# Patient Record
Sex: Female | Born: 1980
Health system: Southern US, Community
[De-identification: ages and names within clinical notes are randomized; demographics above are authoritative.]

## PROBLEM LIST (undated history)

## (undated) ENCOUNTER — Inpatient Hospital Stay (HOSPITAL_COMMUNITY): Payer: Self-pay

## (undated) DIAGNOSIS — J45909 Unspecified asthma, uncomplicated: Secondary | ICD-10-CM

## (undated) HISTORY — PX: NO PAST SURGERIES: SHX2092

---

## 2008-09-10 ENCOUNTER — Inpatient Hospital Stay (HOSPITAL_COMMUNITY): Admission: AD | Admit: 2008-09-10 | Discharge: 2008-09-10 | Payer: Self-pay | Admitting: Obstetrics & Gynecology

## 2008-10-18 ENCOUNTER — Inpatient Hospital Stay (HOSPITAL_COMMUNITY): Admission: AD | Admit: 2008-10-18 | Discharge: 2008-10-18 | Payer: Self-pay | Admitting: Obstetrics & Gynecology

## 2008-11-22 ENCOUNTER — Ambulatory Visit (HOSPITAL_COMMUNITY): Admission: RE | Admit: 2008-11-22 | Discharge: 2008-11-22 | Payer: Self-pay | Admitting: Obstetrics & Gynecology

## 2008-12-13 ENCOUNTER — Ambulatory Visit (HOSPITAL_COMMUNITY): Admission: RE | Admit: 2008-12-13 | Discharge: 2008-12-13 | Payer: Self-pay | Admitting: Family Medicine

## 2009-04-23 ENCOUNTER — Inpatient Hospital Stay (HOSPITAL_COMMUNITY): Admission: AD | Admit: 2009-04-23 | Discharge: 2009-04-25 | Payer: Self-pay | Admitting: Obstetrics & Gynecology

## 2009-04-23 ENCOUNTER — Ambulatory Visit: Payer: Self-pay | Admitting: Obstetrics and Gynecology

## 2009-05-21 ENCOUNTER — Emergency Department (HOSPITAL_COMMUNITY): Admission: EM | Admit: 2009-05-21 | Discharge: 2009-05-21 | Payer: Self-pay | Admitting: Emergency Medicine

## 2009-05-29 ENCOUNTER — Ambulatory Visit: Payer: Self-pay | Admitting: Family Medicine

## 2010-06-01 ENCOUNTER — Emergency Department (HOSPITAL_COMMUNITY): Admission: EM | Admit: 2010-06-01 | Discharge: 2010-06-01 | Payer: Self-pay | Admitting: Emergency Medicine

## 2010-08-29 ENCOUNTER — Encounter (INDEPENDENT_AMBULATORY_CARE_PROVIDER_SITE_OTHER): Payer: Self-pay | Admitting: *Deleted

## 2010-08-29 LAB — CONVERTED CEMR LAB
ALT: 11 units/L (ref 0–35)
CO2: 27 meq/L (ref 19–32)
Creatinine, Ser: 0.56 mg/dL (ref 0.40–1.20)
Magnesium: 2.2 mg/dL (ref 1.5–2.5)
TSH: 1.78 microintl units/mL (ref 0.350–4.500)
Total Bilirubin: 0.3 mg/dL (ref 0.3–1.2)
Total CK: 37 units/L (ref 7–177)

## 2010-10-07 LAB — POCT URINALYSIS DIPSTICK
Bilirubin Urine: NEGATIVE
Glucose, UA: NEGATIVE mg/dL
Hgb urine dipstick: NEGATIVE
Nitrite: NEGATIVE

## 2010-10-07 LAB — POCT PREGNANCY, URINE: Preg Test, Ur: NEGATIVE

## 2010-10-30 LAB — CBC
HCT: 38.5 % (ref 36.0–46.0)
Hemoglobin: 13.1 g/dL (ref 12.0–15.0)
MCV: 85.3 fL (ref 78.0–100.0)
Platelets: 202 10*3/uL (ref 150–400)
RDW: 14 % (ref 11.5–15.5)
WBC: 6.8 10*3/uL (ref 4.0–10.5)

## 2010-10-30 LAB — URINE MICROSCOPIC-ADD ON

## 2010-10-30 LAB — DIFFERENTIAL
Eosinophils Absolute: 0.1 10*3/uL (ref 0.0–0.7)
Eosinophils Relative: 1 % (ref 0–5)
Lymphs Abs: 2.1 10*3/uL (ref 0.7–4.0)
Monocytes Absolute: 0.5 10*3/uL (ref 0.1–1.0)

## 2010-10-30 LAB — BASIC METABOLIC PANEL
BUN: 11 mg/dL (ref 6–23)
Chloride: 105 mEq/L (ref 96–112)
Glucose, Bld: 90 mg/dL (ref 70–99)
Potassium: 4.6 mEq/L (ref 3.5–5.1)
Sodium: 141 mEq/L (ref 135–145)

## 2010-10-30 LAB — URINALYSIS, ROUTINE W REFLEX MICROSCOPIC
Nitrite: NEGATIVE
Protein, ur: 30 mg/dL — AB
Urobilinogen, UA: 0.2 mg/dL (ref 0.0–1.0)

## 2010-10-30 LAB — WET PREP, GENITAL
WBC, Wet Prep HPF POC: NONE SEEN
Yeast Wet Prep HPF POC: NONE SEEN

## 2010-10-30 LAB — GC/CHLAMYDIA PROBE AMP, GENITAL: GC Probe Amp, Genital: NEGATIVE

## 2010-10-31 LAB — RPR: RPR Ser Ql: NONREACTIVE

## 2010-10-31 LAB — URINALYSIS, MICROSCOPIC ONLY
Glucose, UA: NEGATIVE mg/dL
Ketones, ur: NEGATIVE mg/dL
Nitrite: NEGATIVE
Specific Gravity, Urine: 1.01 (ref 1.005–1.030)
pH: 6.5 (ref 5.0–8.0)

## 2010-10-31 LAB — CBC
Hemoglobin: 11.5 g/dL — ABNORMAL LOW (ref 12.0–15.0)
MCHC: 33.4 g/dL (ref 30.0–36.0)
RBC: 4.06 MIL/uL (ref 3.87–5.11)
WBC: 8.8 10*3/uL (ref 4.0–10.5)

## 2010-11-04 ENCOUNTER — Inpatient Hospital Stay (INDEPENDENT_AMBULATORY_CARE_PROVIDER_SITE_OTHER)
Admission: RE | Admit: 2010-11-04 | Discharge: 2010-11-04 | Disposition: A | Discharge status: Home / Self Care | Payer: Self-pay | Source: Ambulatory Visit | Attending: Family Medicine | Admitting: Family Medicine

## 2010-11-04 DIAGNOSIS — J309 Allergic rhinitis, unspecified: Secondary | ICD-10-CM

## 2010-11-06 LAB — DIFFERENTIAL
Basophils Relative: 0 % (ref 0–1)
Eosinophils Absolute: 0.2 10*3/uL (ref 0.0–0.7)
Lymphocytes Relative: 19 % (ref 12–46)
Lymphs Abs: 2.1 10*3/uL (ref 0.7–4.0)
Neutro Abs: 8.1 10*3/uL — ABNORMAL HIGH (ref 1.7–7.7)
Neutrophils Relative %: 71 % (ref 43–77)

## 2010-11-06 LAB — URINALYSIS, ROUTINE W REFLEX MICROSCOPIC
Bilirubin Urine: NEGATIVE
Hgb urine dipstick: NEGATIVE
Protein, ur: NEGATIVE mg/dL
Specific Gravity, Urine: 1.015 (ref 1.005–1.030)
Urobilinogen, UA: 0.2 mg/dL (ref 0.0–1.0)

## 2010-11-06 LAB — CBC
Platelets: 184 10*3/uL (ref 150–400)
RDW: 12.5 % (ref 11.5–15.5)
WBC: 11.5 10*3/uL — ABNORMAL HIGH (ref 4.0–10.5)

## 2010-11-11 LAB — CBC
Hemoglobin: 13.1 g/dL (ref 12.0–15.0)
MCV: 86.7 fL (ref 78.0–100.0)
RBC: 4.48 MIL/uL (ref 3.87–5.11)
WBC: 10.3 10*3/uL (ref 4.0–10.5)

## 2010-11-11 LAB — URINALYSIS, ROUTINE W REFLEX MICROSCOPIC
Bilirubin Urine: NEGATIVE
Hgb urine dipstick: NEGATIVE
Nitrite: NEGATIVE
Specific Gravity, Urine: 1.005 (ref 1.005–1.030)
Urobilinogen, UA: 0.2 mg/dL (ref 0.0–1.0)
pH: 6 (ref 5.0–8.0)

## 2010-11-11 LAB — HCG, QUANTITATIVE, PREGNANCY: hCG, Beta Chain, Quant, S: 13511 m[IU]/mL — ABNORMAL HIGH (ref <5)

## 2010-11-11 LAB — ABO/RH: ABO/RH(D): A POS

## 2010-11-11 LAB — GC/CHLAMYDIA PROBE AMP, GENITAL: Chlamydia, DNA Probe: NEGATIVE

## 2011-01-15 ENCOUNTER — Other Ambulatory Visit: Payer: Self-pay | Admitting: Family Medicine

## 2011-03-14 ENCOUNTER — Emergency Department: Payer: Self-pay

## 2011-03-14 ENCOUNTER — Emergency Department (HOSPITAL_COMMUNITY)
Admission: EM | Admit: 2011-03-14 | Discharge: 2011-03-14 | Disposition: A | Discharge status: Home / Self Care | Payer: Self-pay | Attending: Emergency Medicine | Admitting: Emergency Medicine

## 2011-03-14 DIAGNOSIS — IMO0001 Reserved for inherently not codable concepts without codable children: Secondary | ICD-10-CM | POA: Insufficient documentation

## 2011-03-14 DIAGNOSIS — R109 Unspecified abdominal pain: Secondary | ICD-10-CM | POA: Insufficient documentation

## 2011-03-14 DIAGNOSIS — R1110 Vomiting, unspecified: Secondary | ICD-10-CM | POA: Insufficient documentation

## 2011-03-14 DIAGNOSIS — J45909 Unspecified asthma, uncomplicated: Secondary | ICD-10-CM | POA: Insufficient documentation

## 2011-03-14 LAB — CBC
Platelets: 222 10*3/uL (ref 150–400)
RBC: 4.44 MIL/uL (ref 3.87–5.11)
RDW: 12.6 % (ref 11.5–15.5)
WBC: 10 10*3/uL (ref 4.0–10.5)

## 2011-03-14 LAB — COMPREHENSIVE METABOLIC PANEL
AST: 13 U/L (ref 0–37)
CO2: 28 mEq/L (ref 19–32)
Calcium: 9.5 mg/dL (ref 8.4–10.5)
Creatinine, Ser: 0.68 mg/dL (ref 0.50–1.10)
GFR calc non Af Amer: >60 mL/min (ref >60)
Total Protein: 7.2 g/dL (ref 6.0–8.3)

## 2011-03-14 LAB — URINALYSIS, ROUTINE W REFLEX MICROSCOPIC
Bilirubin Urine: NEGATIVE
Leukocytes, UA: NEGATIVE
Nitrite: NEGATIVE
Specific Gravity, Urine: 1.02 (ref 1.005–1.030)
Urobilinogen, UA: 0.2 mg/dL (ref 0.0–1.0)
pH: 6 (ref 5.0–8.0)

## 2011-03-14 LAB — DIFFERENTIAL
Basophils Absolute: 0 10*3/uL (ref 0.0–0.1)
Basophils Relative: 0 % (ref 0–1)
Eosinophils Absolute: 0.1 10*3/uL (ref 0.0–0.7)
Eosinophils Relative: 1 % (ref 0–5)
Neutrophils Relative %: 59 % (ref 43–77)

## 2011-03-14 LAB — URINE MICROSCOPIC-ADD ON

## 2011-03-14 LAB — POCT PREGNANCY, URINE: Preg Test, Ur: NEGATIVE

## 2011-07-03 ENCOUNTER — Encounter: Payer: Self-pay | Admitting: Emergency Medicine

## 2011-07-03 ENCOUNTER — Emergency Department (HOSPITAL_COMMUNITY): Payer: Self-pay

## 2011-07-03 ENCOUNTER — Other Ambulatory Visit: Payer: Self-pay

## 2011-07-03 ENCOUNTER — Emergency Department (HOSPITAL_COMMUNITY)
Admission: EM | Admit: 2011-07-03 | Discharge: 2011-07-03 | Disposition: A | Discharge status: Home / Self Care | Payer: Self-pay | Attending: Emergency Medicine | Admitting: Emergency Medicine

## 2011-07-03 DIAGNOSIS — M546 Pain in thoracic spine: Secondary | ICD-10-CM | POA: Insufficient documentation

## 2011-07-03 DIAGNOSIS — R509 Fever, unspecified: Secondary | ICD-10-CM | POA: Insufficient documentation

## 2011-07-03 DIAGNOSIS — R0789 Other chest pain: Secondary | ICD-10-CM | POA: Insufficient documentation

## 2011-07-03 DIAGNOSIS — N644 Mastodynia: Secondary | ICD-10-CM | POA: Insufficient documentation

## 2011-07-03 DIAGNOSIS — R0602 Shortness of breath: Secondary | ICD-10-CM | POA: Insufficient documentation

## 2011-07-03 LAB — CBC
Hemoglobin: 13.3 g/dL (ref 12.0–15.0)
MCHC: 34.7 g/dL (ref 30.0–36.0)
RDW: 12.4 % (ref 11.5–15.5)

## 2011-07-03 LAB — URINALYSIS, ROUTINE W REFLEX MICROSCOPIC
Glucose, UA: NEGATIVE mg/dL
Leukocytes, UA: NEGATIVE
Protein, ur: NEGATIVE mg/dL
Specific Gravity, Urine: 1.018 (ref 1.005–1.030)
Urobilinogen, UA: 0.2 mg/dL (ref 0.0–1.0)

## 2011-07-03 LAB — PREGNANCY, URINE: Preg Test, Ur: NEGATIVE

## 2011-07-03 LAB — D-DIMER, QUANTITATIVE: D-Dimer, Quant: 1.89 ug/mL-FEU — ABNORMAL HIGH (ref 0.00–0.48)

## 2011-07-03 LAB — DIFFERENTIAL
Basophils Absolute: 0 10*3/uL (ref 0.0–0.1)
Basophils Relative: 0 % (ref 0–1)
Neutro Abs: 13.6 10*3/uL — ABNORMAL HIGH (ref 1.7–7.7)
Neutrophils Relative %: 77 % (ref 43–77)

## 2011-07-03 LAB — BASIC METABOLIC PANEL
Chloride: 101 mEq/L (ref 96–112)
GFR calc Af Amer: >90 mL/min (ref >90)
Potassium: 3.5 mEq/L (ref 3.5–5.1)

## 2011-07-03 MED ORDER — IOHEXOL 300 MG/ML  SOLN
100.0000 mL | Freq: Once | INTRAMUSCULAR | Status: AC | PRN
  Administered 2011-07-03: 100 mL via INTRAVENOUS

## 2011-07-03 MED ORDER — OXYCODONE-ACETAMINOPHEN 5-325 MG PO TABS
2.0000 | ORAL_TABLET | Freq: Once | ORAL | Status: AC
  Administered 2011-07-03: 2 via ORAL
  Filled 2011-07-03: qty 2

## 2011-07-03 MED ORDER — IBUPROFEN 800 MG PO TABS: 800.0000 mg | ORAL_TABLET | Freq: Three times a day (TID) | ORAL | Status: AC

## 2011-07-03 MED ORDER — IBUPROFEN 800 MG PO TABS
800.0000 mg | ORAL_TABLET | Freq: Once | ORAL | Status: AC
  Administered 2011-07-03: 800 mg via ORAL
  Filled 2011-07-03: qty 1

## 2011-07-03 NOTE — ED Notes (Signed)
Pt returned from CT °

## 2011-07-03 NOTE — ED Notes (Signed)
Patient transported to CT 

## 2011-07-03 NOTE — ED Notes (Signed)
PT. REPORTS LEFT BREAST PAIN / GENERALIZED BACK PAIN ONSET 9 PM THIS EVENING .  SLIGHT SOB , DENIES COUGH  PAIN WITH PALPATION ,  FRIEND TRANSLATING (SPANISH) FOR PT.  CHILLS AND FEVER TODAY .

## 2011-07-03 NOTE — ED Provider Notes (Signed)
History     CSN: [id number] Arrival date & time: 07/03/2011 12:19 AM   First MD Initiated Contact with Patient 07/03/11 0111      Chief Complaint  Patient presents with  . Breast Pain    (Consider location/radiation/quality/duration/timing/severity/associated sxs/prior treatment) HPI Comments: Spanish-speaking female presenting with left breast pain, left-sided chest pain, left upper back pain onset 4 hours ago. Is associated with some shortness of breath and subjective fevers and chills. Patient reports no medical history no history of cardiac or lung problems. She's not had any cough.  She taking ibuprofen for the pain with some relief. She's had chills and subjective fevers. Is no bleeding or drainage from her breast or nipple. She does not think that she is pregnant. She had this pain in her breast last month it improved with ibuprofen and went away. It is not coincide with her menstrual period. She denies any trauma to the breast.  The history is provided by the patient and a friend. The history is limited by a language barrier. A language interpreter was used.    History reviewed. No pertinent past medical history.  History reviewed. No pertinent past surgical history.  No family history on file.  History  Substance Use Topics  . Smoking status: Never Smoker   . Smokeless tobacco: Not on file  . Alcohol Use: No    OB History    Grav Para Term Preterm Abortions TAB SAB Ect Mult Living                  Review of Systems  Unable to perform ROS: Other    Allergies  Review of patient's allergies indicates no known allergies.  Home Medications   Current Outpatient Rx  Name Route Sig Dispense Refill  . IBUPROFEN 600 MG PO TABS Oral Take 600 mg by mouth every 6 (six) hours as needed. For pain     . IBUPROFEN 800 MG PO TABS Oral Take 1 tablet (800 mg total) by mouth 3 (three) times daily. 21 tablet 0    BP 111/65  Pulse 98  Temp(Src) 97.4 F (36.3 C) (Oral)  Resp  24  SpO2 98%  LMP 06/23/2011  Physical Exam  Constitutional: She is oriented to person, place, and time. She appears well-developed and well-nourished. No distress.  HENT:  Head: Normocephalic and atraumatic.  Mouth/Throat: Oropharynx is clear and moist. No oropharyngeal exudate.  Eyes: Conjunctivae are normal. Pupils are equal, round, and reactive to light.  Neck: Normal range of motion.  Cardiovascular: Normal rate, regular rhythm and normal heart sounds.   Pulmonary/Chest: Effort normal and breath sounds normal. No respiratory distress. She exhibits tenderness.       Breast exam performed with chaperone in the room. Breasts are symmetrical and equal. There is no overlying skin change. The left breast is diffusely tender to palpation without any erythema, edema, mass, fluctuance. No nipple discharge. No skin retraction. Left upper chest wall and upper back also tender to palpation  Abdominal: Soft. There is no tenderness. There is no rebound and no guarding.  Musculoskeletal: Normal range of motion. She exhibits no edema and no tenderness.  Neurological: She is alert and oriented to person, place, and time. No cranial nerve deficit.  Skin: Skin is warm.    ED Course  Procedures (including critical care time)  Labs Reviewed  CBC - Abnormal; Notable for the following:    WBC 17.7 (*)    All other components within normal limits  DIFFERENTIAL -  Abnormal; Notable for the following:    Neutro Abs 13.6 (*)    Monocytes Absolute 1.4 (*)    All other components within normal limits  D-DIMER, QUANTITATIVE - Abnormal; Notable for the following:    D-Dimer, Quant 1.89 (*)    All other components within normal limits  URINALYSIS, ROUTINE W REFLEX MICROSCOPIC  PREGNANCY, URINE  BASIC METABOLIC PANEL   Dg Chest 2 View  07/03/2011  *RADIOLOGY REPORT*  Clinical Data: Left chest pain for 1 day.  CHEST - 2 VIEW  Comparison: None.  Findings: The heart size and mediastinal contours are normal.  There are low lung volumes with mild bibasilar atelectasis.  No focal airspace disease, pleural effusion or pneumothorax is identified.  Osseous structures appear normal.  IMPRESSION: Mild bibasilar atelectasis.  Original Report Authenticated By: Gerrianne Scale, M.D.   Ct Angio Chest W/cm &/or Wo Cm  07/03/2011  *RADIOLOGY REPORT*  Clinical Data:  Left-sided chest pain and elevated D-dimer.  CT ANGIOGRAPHY CHEST WITH CONTRAST  Technique:  Multidetector CT imaging of the chest was performed using the standard protocol during bolus administration of intravenous contrast.  Multiplanar CT image reconstructions including MIPs were obtained to evaluate the vascular anatomy.  Contrast: OMNIPAQUE IOHEXOL 300 MG/ML IV SOLN  Comparison:  Chest radiograph performed earlier today at 01:31 a.m.  Findings:  There is no evidence of pulmonary embolus.  Minimal bilateral dependent subsegmental atelectasis is noted.  The lungs are otherwise clear.  There is no evidence of significant focal consolidation, pleural effusion or pneumothorax.  No masses are identified; no abnormal focal contrast enhancement is seen.  The mediastinum is unremarkable in appearance.  No mediastinal lymphadenopathy is seen.  No pericardial effusion is identified. The great vessels are unremarkable in appearance.  No axillary lymphadenopathy is seen.  The visualized portions of the thyroid gland are unremarkable in appearance.  The visualized portions of the liver and spleen are unremarkable.  No acute osseous abnormalities are seen.  Review of the MIP images confirms the above findings.  IMPRESSION:  1.  No evidence of pulmonary embolus. 2.  Minimal bilateral dependent subsegmental atelectasis noted; lungs otherwise clear.  Original Report Authenticated By: Tonia Ghent, M.D.     1. Mastalgia       MDM  Reproducible left-sided chest, upper back pain with slight shortness of breath. Patient is in no distress, lungs clear and equal  bilaterally without wheezing.  We'll perform breast exam, obtain EKG, chest Xray and D-dimer, HCG, UA.  No overlying skin change to breast.  No nipple discharge, no skin retraction.  No fluctuance, erythema, induration or edema. Breasts symmetrical.   Date: 07/03/2011  Rate: 85 Rhythm: normal sinus rhythm  QRS Axis: normal  Intervals: normal  ST/T Wave abnormalities: normal  Conduction Disutrbances:none  Narrative Interpretation:   Old EKG Reviewed: none available   CT PE negative.  Pain has improved.  Leukocytosis noted. No evidence of pneumonia, UTI, skin infection of the breast. Patient stable for outpatient followup. Results discussed through interpreter.  Patient and husband express understanding.   Glynn Octave, MD 07/03/11 [phone]

## 2011-07-03 NOTE — ED Notes (Signed)
Pt returned from xray

## 2011-07-03 NOTE — ED Notes (Signed)
Patient transported to X-ray 

## 2012-01-22 ENCOUNTER — Other Ambulatory Visit (HOSPITAL_COMMUNITY): Payer: Self-pay | Admitting: Internal Medicine

## 2012-01-22 ENCOUNTER — Ambulatory Visit (HOSPITAL_COMMUNITY)
Admission: RE | Admit: 2012-01-22 | Discharge: 2012-01-22 | Disposition: A | Discharge status: Home / Self Care | Payer: Self-pay | Source: Ambulatory Visit | Attending: Internal Medicine | Admitting: Internal Medicine

## 2012-01-22 DIAGNOSIS — R52 Pain, unspecified: Secondary | ICD-10-CM

## 2012-01-22 DIAGNOSIS — M25539 Pain in unspecified wrist: Secondary | ICD-10-CM | POA: Insufficient documentation

## 2013-06-05 IMAGING — CT CT ANGIO CHEST
2 of 6 series · 19 of 46 positions shown · IV contrast (APPLIED)
Comparison: Chest radiograph performed earlier today at [DATE] a.m.

CLINICAL DATA: Left-sided chest pain and elevated D-dimer.

CT ANGIOGRAPHY CHEST WITH CONTRAST
TECHNIQUE: Multidetector CT imaging of the chest was performed
using the standard protocol during bolus administration of
intravenous contrast.  Multiplanar CT image reconstructions
including MIPs were obtained to evaluate the vascular anatomy.
Contrast: 100mL OMNIPAQUE IOHEXOL 300 MG/ML IV SOLN

[Series 6: pulm embolism 1.0 b25f thin · axial · 0.63mm/px · z∈[+1168,+1353]mm · 16 of 203 slices shown]
[im 9/203  lung]
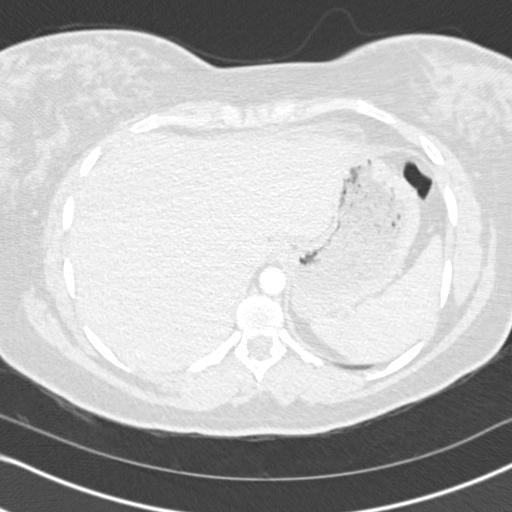
[im 27/203  soft-tissue]
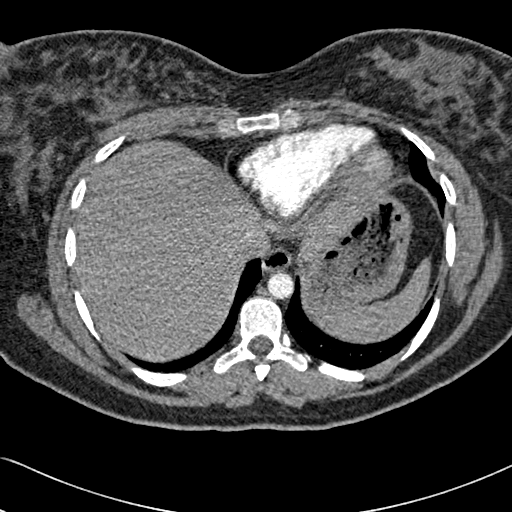
[im 36/203  lung]
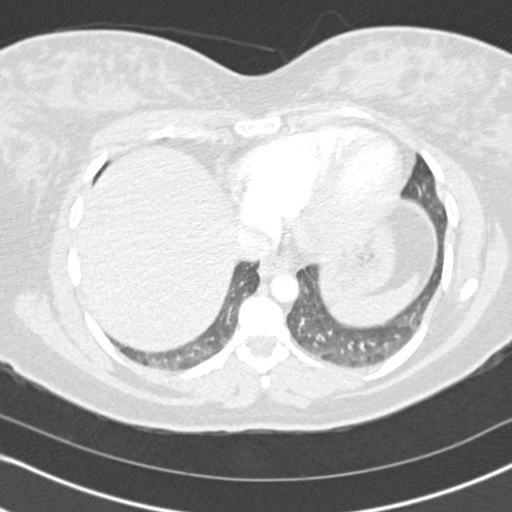
[im 44/203  soft-tissue]
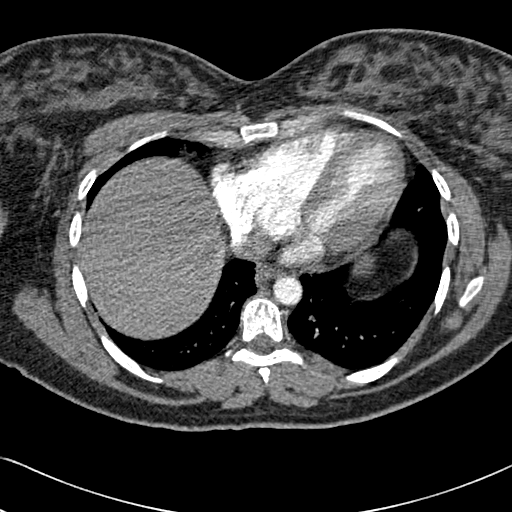
[im 62/203  lung]
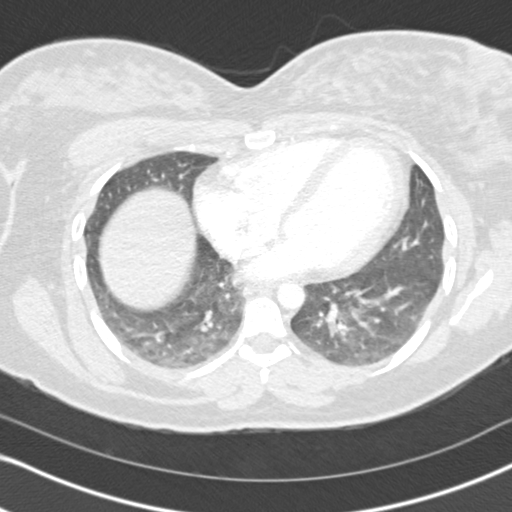
[im 71/203  soft-tissue]
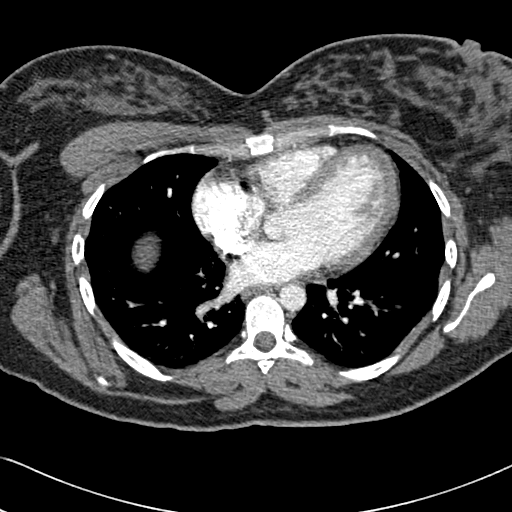
[im 80/203  lung]
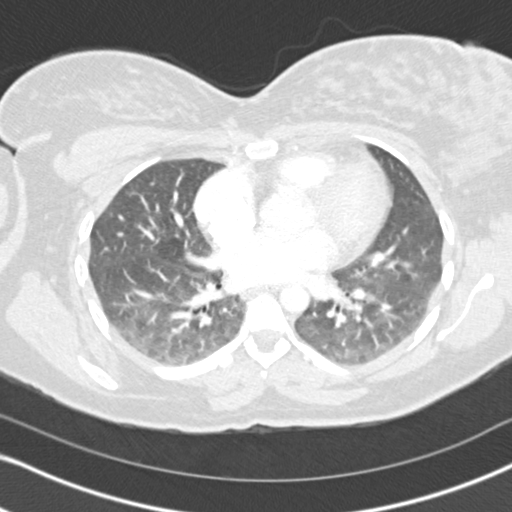
[im 97/203  soft-tissue]
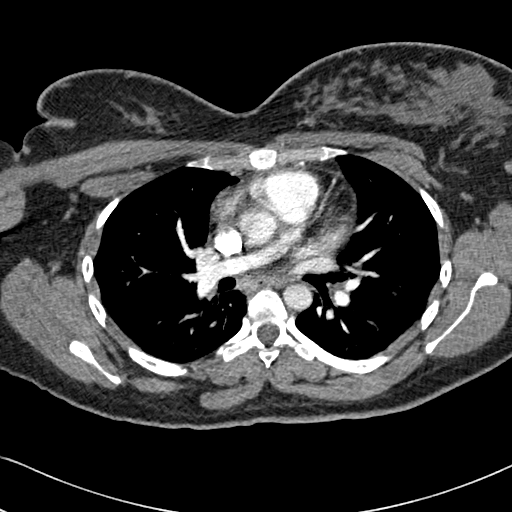
[im 106/203  lung]
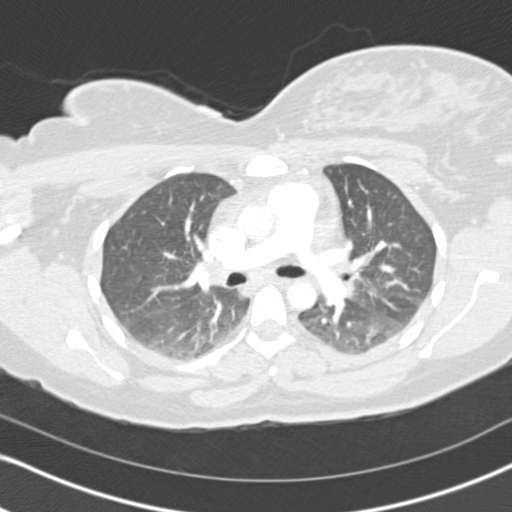
[im 123/203  soft-tissue]
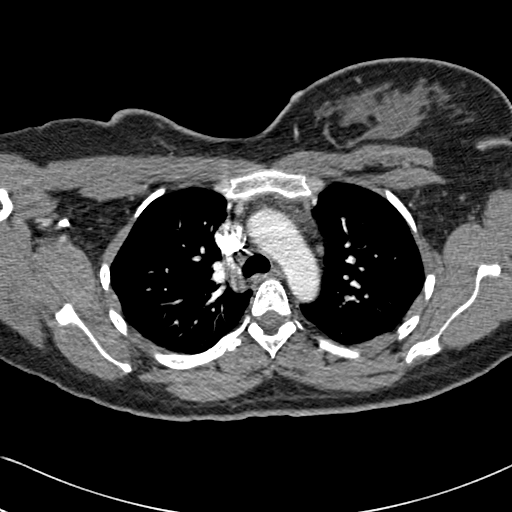
[im 132/203  lung]
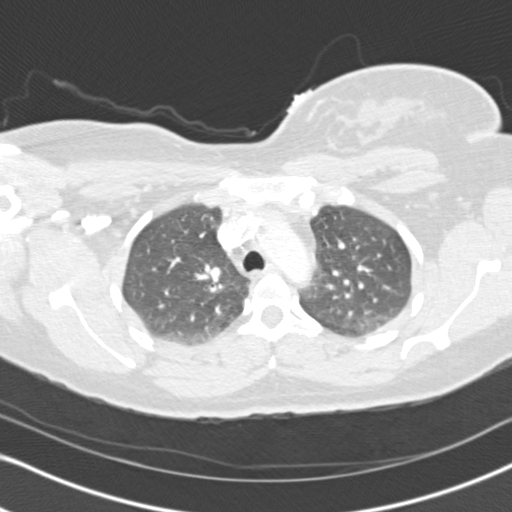
[im 141/203  soft-tissue]
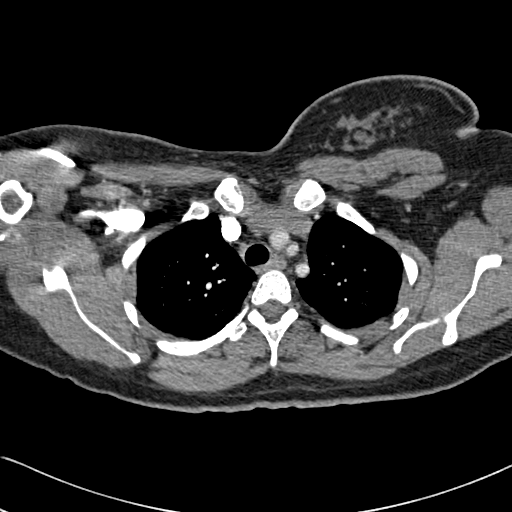
[im 159/203  lung]
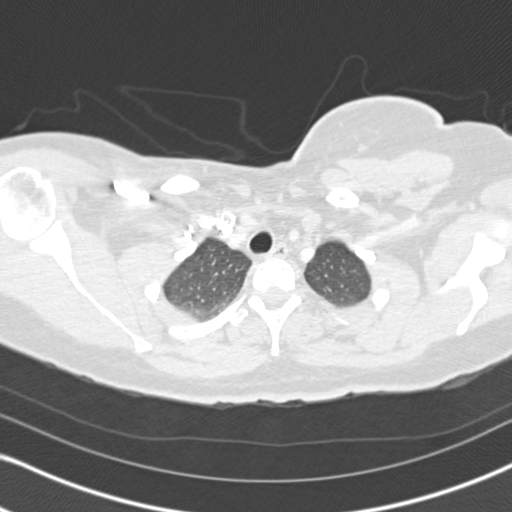
[im 167/203  soft-tissue]
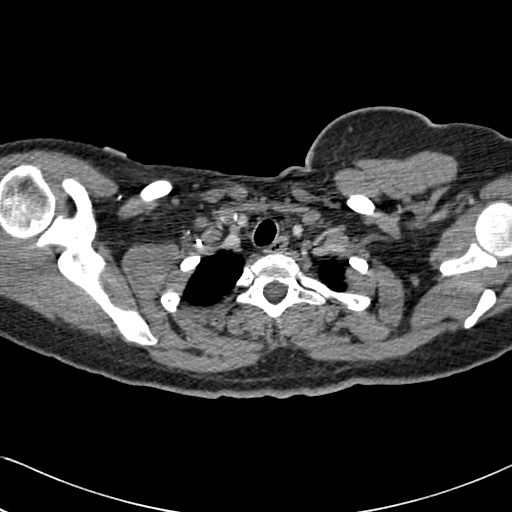
[im 176/203  lung]
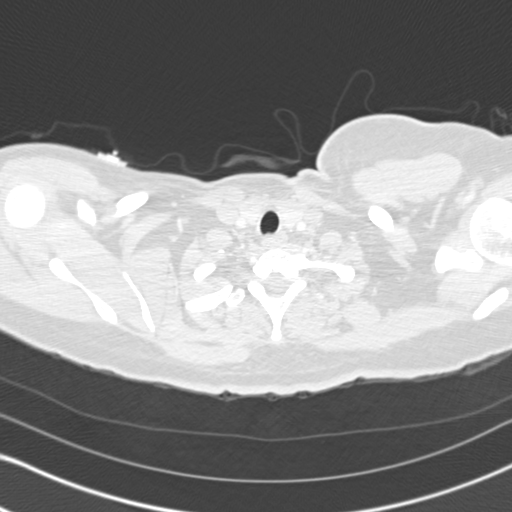
[im 194/203  soft-tissue]
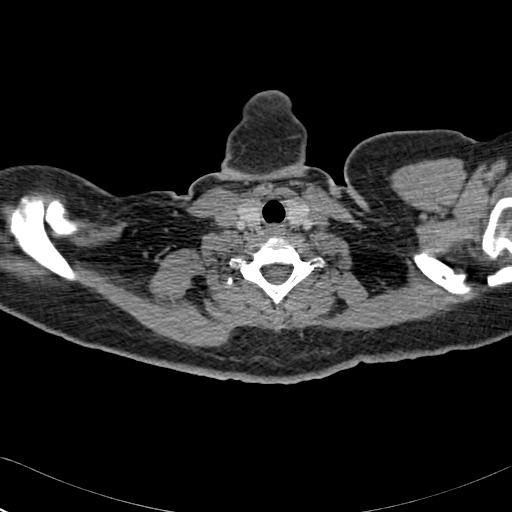

[Series 602: coronal · coronal · 0.63mm/px · 3 of 96 slices shown]
[im 24/96  soft-tissue]
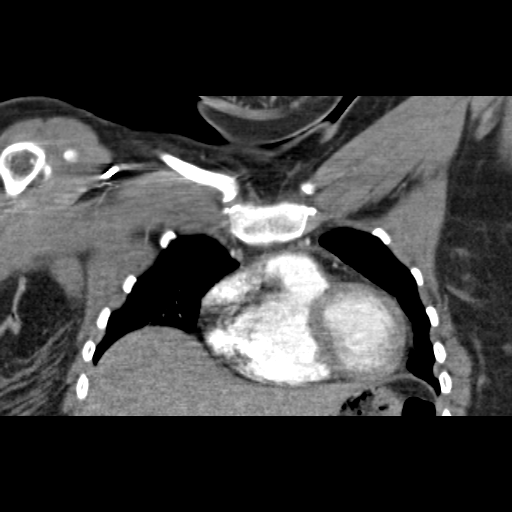
[im 48/96  soft-tissue]
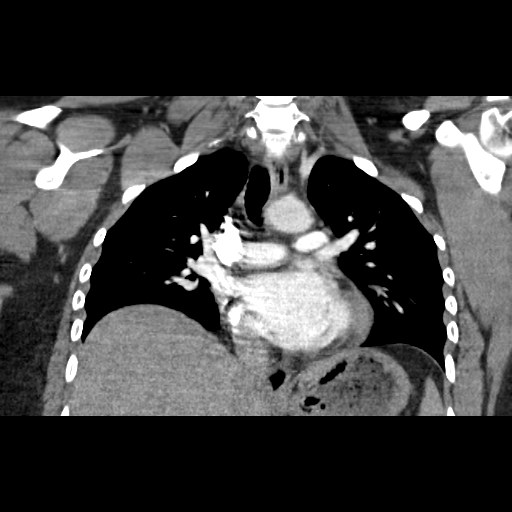
[im 72/96  soft-tissue]
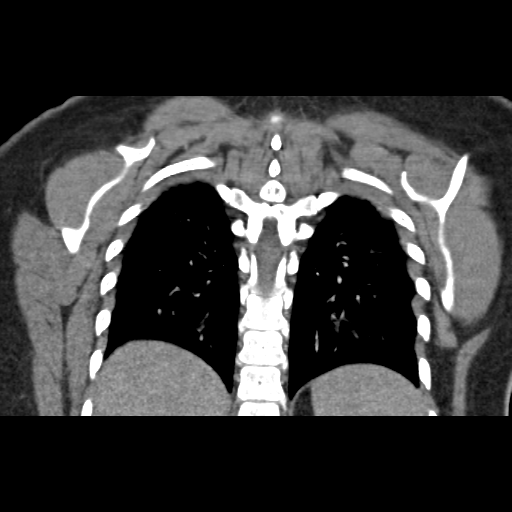

[19 of 46 positions shown; findings below may reference images not displayed]

FINDINGS: There is no evidence of pulmonary embolus.

Minimal bilateral dependent subsegmental atelectasis is noted.  The
lungs are otherwise clear.  There is no evidence of significant
focal consolidation, pleural effusion or pneumothorax.  No masses
are identified; no abnormal focal contrast enhancement is seen.

The mediastinum is unremarkable in appearance.  No mediastinal
lymphadenopathy is seen.  No pericardial effusion is identified.
The great vessels are unremarkable in appearance.  No axillary
lymphadenopathy is seen.  The visualized portions of the thyroid
gland are unremarkable in appearance.

The visualized portions of the liver and spleen are unremarkable.

No acute osseous abnormalities are seen.

Review of the MIP images confirms the above findings.
IMPRESSION: 1.  No evidence of pulmonary embolus.
2.  Minimal bilateral dependent subsegmental atelectasis noted;
lungs otherwise clear.

## 2013-09-11 ENCOUNTER — Ambulatory Visit: Payer: No Typology Code available for payment source | Attending: Internal Medicine

## 2013-09-25 ENCOUNTER — Telehealth: Payer: Self-pay | Admitting: General Practice

## 2013-09-25 NOTE — Telephone Encounter (Signed)
Called pt and left voicemail stating that pt's [facility] orange card is ready to be picked up.

## 2013-10-02 ENCOUNTER — Emergency Department (HOSPITAL_COMMUNITY)
Admission: EM | Admit: 2013-10-02 | Discharge: 2013-10-02 | Disposition: A | Discharge status: Home / Self Care | Payer: No Typology Code available for payment source | Attending: Emergency Medicine | Admitting: Emergency Medicine

## 2013-10-02 ENCOUNTER — Encounter (HOSPITAL_COMMUNITY): Payer: Self-pay | Admitting: Emergency Medicine

## 2013-10-02 ENCOUNTER — Emergency Department (HOSPITAL_COMMUNITY): Payer: No Typology Code available for payment source

## 2013-10-02 DIAGNOSIS — M25571 Pain in right ankle and joints of right foot: Secondary | ICD-10-CM

## 2013-10-02 DIAGNOSIS — M25579 Pain in unspecified ankle and joints of unspecified foot: Secondary | ICD-10-CM | POA: Insufficient documentation

## 2013-10-02 DIAGNOSIS — W19XXXS Unspecified fall, sequela: Secondary | ICD-10-CM | POA: Insufficient documentation

## 2013-10-02 MED ORDER — NAPROXEN 500 MG PO TABS: 500.0000 mg | ORAL_TABLET | Freq: Two times a day (BID) | ORAL | Status: DC

## 2013-10-02 MED ORDER — TRAMADOL HCL 50 MG PO TABS: 50.0000 mg | ORAL_TABLET | Freq: Four times a day (QID) | ORAL | Status: DC | PRN

## 2013-10-02 NOTE — ED Notes (Signed)
Pt here for eval of left ankle pain due to old ankle injury.

## 2013-10-02 NOTE — Discharge Instructions (Signed)
Recommend rest, naproxen as prescribed for pain, elevation, and support with an ankle brace. You may take tramadol as needed for severe pain if naproxen does not manage or pain well enough. Followup with an orthopedist. Return if symptoms worsen.  Artralgia (Arthralgia) El profesional que le asiste ha diagnosticado que usted padece de artralgia. Artralgia significa simplemente dolor en una articulacin. Las causas pueden ser muchas; entre ellas se incluyen:  Una lesin en la articulacin que provoca inflamacin (molestias).  Desgaste de las articulaciones que se produce con el avance de los aos (osteoartritis).  Uso excesivo de Nurse, learning disability.  Diversas formas de artritis.  Infecciones en la articulacin. Cualquiera que sea la causa del dolor, generalmente hay buena respuesta a los antiinflamatorios y al reposo. La excepcin ocurre cuando la articulacin est infectada y [address], si la causa es una infeccin bacteriana (por una bacteria), se tratan con antibiticos (medicamentos que [employer] grmenes). INSTRUCCIONES PARA EL CUIDADO DOMICILIARIO  Mantenga en reposo la zona lesionada durante el tiempo que se lo indique su mdico, o el tiempo que le indique el profesional que lo asiste. Luego comience a Chemical engineer esa articulacin lentamente del modo en que se lo aconseje el profesional y a Dentist se lo permita. El uso de [employer] puede ser beneficioso en el caso de lesiones en el tobillo, rodilla o cadera. Si la rodilla est entablillada o enyesada, muvala y cudela como se le indic. Si hoy le han colocado un venda elstica o que se estira), debe retirarlo y colocarlo nuevamente cada 3  4 horas. No debe aplicarlo de manera muy apretada, pero s con la firmeza necesaria para evitar la hinchazn. Vigile los dedos de las manos y los pies y observe si se hinchan, se tornan azules, se enfran o entumecen o siente dolor intenso. Si se presenta alguno de estos sntomas (problemas),  retire el vendaje y aplquelo nuevamente de un modo ms flojo. Si estos sntomas persisten, contacte al profesional que lo asiste o acuda nuevamente a Educational psychologist.  Durante las primeras 24 horas, mientras est [place], mantenga elevada la extremidad [employer] 2 almohadas.  Mientras se encuentra despierto Systems analyst aplique hielo durante 15 a 20 minutos, cada dos horas, para Engineer, materials; luego 3 a 4 veces por Environmental consultant las primeras 48 horas. Ponga el hielo en una bolsa plstica y coloque una toalla entre la bolsa y la piel.  Use la tablilla, el yeso, el vendaje elstico o el cabestrillo segn se le ha indicado.  Utilice los medicamentos de venta libre o de prescripcin para Chief Technology Officer, Environmental health practitioner o la [place], segn se lo indique el profesional que lo asiste. No tomeaspirina inmediatamente despus de la lesin a menos que se lo haya indicado su mdico. La aspirina puede ocasionarle un aumento en el sangrado y moretones en los tejidos.  Si se le aconsej la utilizacin de [place], contine usndolas segn las instrucciones y no vuelva a soportar peso sobre la articulacin lesionada hasta que se le indique que puede [place]. Un dolor persistente y la incapacidad para Chemical engineer el rea afectada durante 2  3 das son seales de Control and instrumentation engineer que le indican que debe consultar a un profesional para Education officer, environmental un seguimiento cuanto antes. Al comienzo, una fractura (ruptura del hueso) lineal puede no ser visible en las radiografas. Un dolor e hinchazn persistentes indican que necesita una evaluacin ms profunda, que no debe utilizar la articulacin lesionada o Counselling psychologist (use las muletas si  se le ha indicado) y/o que debe realizarse radiografas complementarias. En muchos casos las radiografas no revelan las fracturas pequeas hasta una semana o diez das ms tarde. Concierte una cita para Medical sales representativerealizar un seguimiento con el profesional que lo asiste o con aqul al que lo hayan remitido. Es  posible que un radilogo (especialista en la lectura de radiografas) revise nuevamente sus placas. Asegrese de Financial risk analystaveriguar cmo retirar los resultados de sus radiografas. No piense que el resultado es normal por el hecho de que no lo hayamos llamado. SOLICITE ATENCIN MDICA SI: Aumentan los moretones, la hinchazn o Chief Technology Officerel dolor. SOLICITE ATENCIN MDICA DE INMEDIATO SI:  Sus dedos estn adormecidos o presentan una Secondary school teachercoloracin azul.  El dolor no responde a los medicamentos y sigue igual o Insurance underwriterempeora.  El dolor en la articulacin se intensifica.  La temperatura eleva por encima de 38,9 C (102 F).  Le resulta cada vez ms difcil moverla. EST SEGURO QUE:   Comprende las instrucciones para el alta mdica.  Controlar su enfermedad.  Solicitar atencin mdica de inmediato segn las indicaciones. Document Released: 07/13/2005 Document Revised: 10/05/2011 [facility] InstituteExitCare Patient Information 2014 [place], [place]. Reposo, hielo, compresin y elevacin: Cuidados de rutina para los traumatismos  (RICE: Routine Care for Injuries) Los cuidados de rutina de muchas lesiones incluyen reposo, hielo, compresin y elevacin. INSTRUCCIONES PARA EL CUIDADO EN EL HOGAR  El reposo es necesario para permitir que el cuerpo se cure. Podrn reanudarse las actividades de rutina cuando se sienta cmodo. Las lesiones en tendones (estructuras similares a una cuerda que unen los msculos al [place]o) y huesos demoran aproximadamente seis semanas en curarse. Si aplicamos hielo luego de una lesin, podemos evitar la hinchazn y Glass blower/designerreducir el dolor. Ponga el hielo en una bolsa plstica. Colquese una toalla entre la piel y la bolsa de hielo. Deje el hielo durante 15 a 20 minutos, 3 a 4 veces por da. Hgalo mientras se encuentre despierto, durante las primeras 24 a 48 horas. Luego, aplquelo segn las indicaciones del profesional que lo asiste. La compresin ayuda a Building services engineerreducir la hinchazn. Tambin brinda sostn y [place] Grenadinesayuda a Clear Channel Communicationsaliviar las  molestias. Si hoy le han colocado un vendaje elstico, debe retirarlo y colocarlo nuevamente cada 3  4 horas. No debe colocarlo de forma muy apretada, pero s con la firmeza necesaria para evitar la hinchazn. Vigile los dedos de las manos o de los pies y observe si se hinchan, se tornan azules, se enfran o entumecen o siente dolor intenso. Si se presentan algunos de estos sntomas (problemas), retire el vendaje y aplquelo nuevamente de un modo ms flojo. Si estos sntomas persisten, contacte al profesional que lo asiste. La elevacin ayuda a reducir la hinchazn y Engineer, materialsdisminuye el dolor. Si la lesin se localiza en las extremidades (brazos/manos y piernas/pies), la zona lesionada debera colocarse por arriba de la altura del corazn, de ser posible. SOLICITE ATENCIN MDICA DE INMEDIATO SI:  El dolor o la hinchazn persisten. La zona est roja, adormecida o la siente dbil. Los sntomas empeoran en vez de mejorar durante Time Warneralgunos das. Estos sntomas pueden indicar que es necesaria una evaluacin ms profunda o nuevas radiografas. En algunos casos, las radiografas no muestran que hay un hueso pequeo roto (fractura) hasta 1 semana o 10 das ms tarde. Cumpla con las citas de seguimiento tal como le indic el profesional que lo asiste. Consulte con su mdico la fecha en que los resultados de las radiografas estarn disponibles. Asegrese de Starbucks Corporationobtener los resultados de las radiografas.  Document Released:  04/22/2005 Document Revised: 10/05/2011 ExitCare Patient Information 2014 [place], [place].

## 2013-10-02 NOTE — ED Notes (Signed)
Discharge instructions reviewed with pt and pt's husband.

## 2013-10-02 NOTE — ED Notes (Signed)
Pt reports "twisting" ankle appx 1 year ago when walking down stairs. Pt reports continued pain and unable to place direct pressure. Pt has mild bruising and swelling to interior of midfoot. Pt has brace in place from pharmacy. Denies ever receiving xray on area.

## 2013-10-02 NOTE — ED Provider Notes (Signed)
CSN: [id number]     Arrival date & time 10/02/13  1736 History   None    This chart was scribed for non-physician practitioner, Antony Madura, PA-C working with Gavin Pound. Oletta Lamas, MD by Arlan Organ, ED Scribe. This patient was seen in room TR04C/TR04C and the patient's care was started at 7:45 PM.   Chief Complaint  Patient presents with  . Ankle Pain   The history is provided by the patient and a relative. A language interpreter was used.    HPI Comments: Karla Reynolds is a 33 y.o. female who presents to the Emergency Department complaining of ongoing, constant left ankle pain x 1 year. Pt reports a fall about 1 year ago resulting in an ankle injury. She states weight bearing exacerbates her pain. She has tried OTC Ibuprofen with mild temporary improvement. She denies trying a brace for her injury. At this time she denies any fever, loss of sensation, numbness, or discoloration. Pt has no pertinent past medical history. No other concerns this visit.   History reviewed. No pertinent past medical history. History reviewed. No pertinent past surgical history. History reviewed. No pertinent family history. History  Substance Use Topics  . Smoking status: Never Smoker   . Smokeless tobacco: Not on file  . Alcohol Use: No   OB History   Grav Para Term Preterm Abortions TAB SAB Ect Mult Living                 Review of Systems  Constitutional: Negative for fever and chills.  Musculoskeletal: Positive for arthralgias.  Skin: Negative for rash.  Neurological: Negative for weakness and numbness.     Allergies  Penicillins  Home Medications   Current Outpatient Rx  Name  Route  Sig  Dispense  Refill  . IBUPROFEN PO   Oral   Take 500 mg by mouth daily as needed (pain).         . naproxen (NAPROSYN) 500 MG tablet   Oral   Take 1 tablet (500 mg total) by mouth 2 (two) times daily.   30 tablet   0   . traMADol (ULTRAM) 50 MG tablet   Oral   Take 1 tablet (50 mg total)  by mouth every 6 (six) hours as needed.   15 tablet   0    Triage Vitals: BP 123/79  Pulse 82  Temp(Src) 98.5 F (36.9 C) (Oral)  Resp 18  SpO2 96%  LMP 09/25/2013   Physical Exam  Nursing note and vitals reviewed. Constitutional: She is oriented to person, place, and time. She appears well-developed and well-nourished. No distress.  HENT:  Head: Normocephalic and atraumatic.  Eyes: Conjunctivae and EOM are normal. No scleral icterus.  Neck: Normal range of motion.  Cardiovascular: Normal rate, regular rhythm and intact distal pulses.   DP and PT pulses 2+ b/l  Pulmonary/Chest: Effort normal. No respiratory distress.  Musculoskeletal: Normal range of motion. She exhibits tenderness (point tenderness just anterior to right medial malleolus).       Right ankle: Normal.       Right foot: She exhibits tenderness and bony tenderness. She exhibits normal range of motion, no swelling, normal capillary refill, no crepitus and no deformity.       Feet:  No swelling, deformity, erythema, or heat to touch. No red linear streaking.  Neurological: She is alert and oriented to person, place, and time. She has normal reflexes. She displays normal reflexes. She exhibits normal muscle tone.  Patellar  and Achilles reflexes 2+ b/l. No gross sensory deficits appreciated.  Skin: Skin is warm and dry. No rash noted. She is not diaphoretic. No erythema. No pallor.  Psychiatric: She has a normal mood and affect. Her behavior is normal.    ED Course  Procedures (including critical care time)  DIAGNOSTIC STUDIES: Oxygen Saturation is 96% on RA, Normal by my interpretation.    COORDINATION OF CARE: 7:48 PM- Will order X-Ray. Will refer to orthopedics. Discussed treatment plan with pt at bedside and pt agreed to plan.     Labs Review Labs Reviewed - No data to display Imaging Review Dg Ankle Complete Right  10/02/2013   CLINICAL DATA:  Right ankle and foot pain today.  EXAM: RIGHT ANKLE - COMPLETE  3+ VIEW  COMPARISON:  None.  FINDINGS: There is no evidence of fracture, dislocation, or joint effusion. There is no evidence of arthropathy or other focal bone abnormality. Soft tissues are unremarkable.  IMPRESSION: Normal exam.   Electronically Signed   By: Geanie CooleyJim  Maxwell M.D.   On: 10/02/2013 18:34   Dg Foot Complete Right  10/02/2013   CLINICAL DATA:  Right foot pain today.  EXAM: RIGHT FOOT COMPLETE - 3+ VIEW  COMPARISON:  None.  FINDINGS: There is no evidence of fracture or dislocation. There is no evidence of arthropathy. The patient has an os naviculare, a normal variant. Soft tissues are unremarkable.  IMPRESSION: Normal exam.   Electronically Signed   By: Geanie CooleyJim  Maxwell M.D.   On: 10/02/2013 18:39     EKG Interpretation None      MDM   Final diagnoses:  Ankle pain, right    Uncomplicated right ankle/foot pain secondary to injury 1 year ago. Patient well and nontoxic appearing, hemodynamically stable, and afebrile. Patient neurovascularly intact on physical exam. No sensory deficits appreciated. Patient able to wiggle all toes. No evidence of septic joint. X-ray negative for fracture, dislocation, or bony deformity. ASO ankle applied in ED and RICE advised. Orthopedic referral provided for further evaluation of symptoms. Return precautions discussed and patient agreeable to plan with no unaddressed concerns.  I personally performed the services described in this documentation, which was scribed in my presence. The recorded information has been reviewed and is accurate.   Filed Vitals:   10/02/13 1742 10/02/13 2004  BP: 123/79 117/74  Pulse: 82 72  Temp: 98.5 F (36.9 C)   TempSrc: Oral   Resp: 18 14  SpO2: 96% 100%     Antony MaduraKelly Aubree Doody, PA-C 10/02/13 2040

## 2013-10-04 NOTE — ED Provider Notes (Signed)
Medical screening examination/treatment/procedure(s) were performed by non-physician practitioner and as supervising physician I was immediately available for consultation/collaboration.   EKG Interpretation None        Karla PoundMichael Y. Oletta LamasGhim, MD 10/04/13 2042

## 2014-02-08 ENCOUNTER — Ambulatory Visit: Payer: No Typology Code available for payment source | Admitting: Internal Medicine

## 2014-02-16 ENCOUNTER — Ambulatory Visit: Payer: No Typology Code available for payment source | Attending: Internal Medicine | Admitting: Internal Medicine

## 2014-02-16 ENCOUNTER — Other Ambulatory Visit (HOSPITAL_COMMUNITY)
Admission: RE | Admit: 2014-02-16 | Discharge: 2014-02-16 | Disposition: A | Discharge status: Home / Self Care | Payer: No Typology Code available for payment source | Source: Ambulatory Visit | Attending: Internal Medicine | Admitting: Internal Medicine

## 2014-02-16 ENCOUNTER — Encounter: Payer: Self-pay | Admitting: Internal Medicine

## 2014-02-16 VITALS — BP 122/75 | HR 61 | Temp 98.1°F | Resp 16 | Ht 62.0 in | Wt 162.0 lb

## 2014-02-16 DIAGNOSIS — R450 Nervousness: Secondary | ICD-10-CM | POA: Insufficient documentation

## 2014-02-16 DIAGNOSIS — Z01419 Encounter for gynecological examination (general) (routine) without abnormal findings: Secondary | ICD-10-CM | POA: Insufficient documentation

## 2014-02-16 DIAGNOSIS — H538 Other visual disturbances: Secondary | ICD-10-CM | POA: Insufficient documentation

## 2014-02-16 DIAGNOSIS — Z0000 Encounter for general adult medical examination without abnormal findings: Secondary | ICD-10-CM

## 2014-02-16 DIAGNOSIS — Z880 Allergy status to penicillin: Secondary | ICD-10-CM | POA: Insufficient documentation

## 2014-02-16 DIAGNOSIS — Z23 Encounter for immunization: Secondary | ICD-10-CM

## 2014-02-16 DIAGNOSIS — R079 Chest pain, unspecified: Secondary | ICD-10-CM | POA: Insufficient documentation

## 2014-02-16 DIAGNOSIS — Z124 Encounter for screening for malignant neoplasm of cervix: Secondary | ICD-10-CM

## 2014-02-16 DIAGNOSIS — N760 Acute vaginitis: Secondary | ICD-10-CM | POA: Insufficient documentation

## 2014-02-16 DIAGNOSIS — R0602 Shortness of breath: Secondary | ICD-10-CM | POA: Insufficient documentation

## 2014-02-16 DIAGNOSIS — Z113 Encounter for screening for infections with a predominantly sexual mode of transmission: Secondary | ICD-10-CM | POA: Insufficient documentation

## 2014-02-16 DIAGNOSIS — Z1151 Encounter for screening for human papillomavirus (HPV): Secondary | ICD-10-CM | POA: Insufficient documentation

## 2014-02-16 NOTE — Progress Notes (Signed)
[name], female   DOB: 02/18/1981, 33 y.o.   MRN: [id number]  MWU:[id number]  [id number]  DOB - October 18, 1980  CC: No chief complaint on file.      HPI: Karla Reynolds is a 33 y.o. female with no past medical history here today to establish medical care.  Patient reports that she is here to establish care and to get a full physical.  Patient denies all vaginal c/o today.  Three weeks ago she reports she had a episode of chest pain, back pain, and SOB.     Allergies  Allergen Reactions  . Penicillins Other (See Comments)    Body goes to sleep   History reviewed. No pertinent past medical history. Current Outpatient Prescriptions on File Prior to Visit  Medication Sig Dispense Refill  . IBUPROFEN PO Take 500 mg by mouth daily as needed (pain).      . naproxen (NAPROSYN) 500 MG tablet Take 1 tablet (500 mg total) by mouth 2 (two) times daily.  30 tablet  0  . traMADol (ULTRAM) 50 MG tablet Take 1 tablet (50 mg total) by mouth every 6 (six) hours as needed.  15 tablet  0   No current facility-administered medications on file prior to visit.   History reviewed. No pertinent family history. History   Social History  . Marital Status: Single    Spouse Name: N/A    Number of Children: N/A  . Years of Education: N/A   Occupational History  . Not on file.   Social History Main Topics  . Smoking status: Never Smoker   . Smokeless tobacco: Not on file  . Alcohol Use: No  . Drug Use: No  . Sexual Activity:    Other Topics Concern  . Not on file   Social History Narrative  . No narrative on file   Review of Systems  Constitutional: Negative.   HENT: Negative.   Eyes: Positive for blurred vision.  Respiratory: Positive for shortness of breath (random). Negative for cough, hemoptysis, sputum production and wheezing.   Cardiovascular: Positive for chest pain (random pain). Negative for palpitations, claudication and leg swelling.    Gastrointestinal: Negative.   Genitourinary: Negative.   Musculoskeletal: Negative.   Neurological: Negative.   Endo/Heme/Allergies: Negative.   Psychiatric/Behavioral: The patient is nervous/anxious.      Objective:   Filed Vitals:   02/16/14 1612  BP: 122/75  Pulse: 61  Temp: 98.1 F (36.7 C)  Resp: 16   Physical Exam  Vitals reviewed. Constitutional: She is oriented to person, place, and time. She appears well-nourished.  HENT:  Right Ear: External ear normal.  Left Ear: External ear normal.  Mouth/Throat: Oropharynx is clear and moist.  Eyes: Conjunctivae and EOM are normal. Pupils are equal, round, and reactive to light.  Neck: Normal range of motion. Neck supple.  Cardiovascular: Normal rate, regular rhythm, normal heart sounds and intact distal pulses.   Pulmonary/Chest: Effort normal and breath sounds normal.  Abdominal: Soft. Bowel sounds are normal. She exhibits no distension. There is no tenderness.  Genitourinary: Vagina normal and uterus normal. No vaginal discharge found.  Musculoskeletal: Normal range of motion.  Neurological: She is alert and oriented to person, place, and time. She has normal reflexes.  Skin: Skin is warm and dry.  Psychiatric: She has a normal mood and affect.     Lab Results  Component Value Date   WBC 17.7* 07/03/2011   HGB 13.3 07/03/2011   HCT 38.3 07/03/2011  MCV 83.6 07/03/2011   PLT 238 07/03/2011   Lab Results  Component Value Date   CREATININE 0.60 07/03/2011   BUN 18 07/03/2011   NA 136 07/03/2011   K 3.5 07/03/2011   CL 101 07/03/2011   CO2 26 07/03/2011    No results found for this basename: HGBA1C   Lipid Panel  No results found for this basename: chol, trig, hdl, cholhdl, vldl, ldlcalc       Assessment and plan:   Vara was seen today for no specified reason.  Diagnoses and associated orders for this visit:  Annual physical exam - CBC; Future - COMPLETE METABOLIC PANEL WITH GFR; Future - Lipid panel;  Future - Hemoglobin A1c; Future - TSH; Future - Tdap vaccine greater than or equal to 7yo IM  Papanicolaou smear - Cytology - PAP [place] - Cervicovaginal ancillary only - HIV antibody (with reflex); Future - RPR; Future     Return in about 1 week (around 02/23/2014) for Lab Visit.  The patient was given clear instructions to go to ER or return to medical center if symptoms don't improve, worsen or new problems develop. The patient verbalized understanding. The patient was told to call to get lab results if they haven't heard anything in the next week.     Holland CommonsKECK, VALERIE, NP-C [facility] [phone] 02/19/2014, 10:49 PM

## 2014-02-16 NOTE — Patient Instructions (Signed)
Clarinda (Health Maintenance) Adoptar un estilo de vida saludable y recibir atencin preventiva pueden ser de suma utilidad para promover la salud y Musician. Hable con el mdico para saber cul es el esquema de exmenes peridicos adecuado para usted. Esta es una buena oportunidad para Teacher, adult education peridicamente al mdico sobre cmo prevenir enfermedades y [place] sano. Entre cada control mdico, hay muchas cosas que puede hacer por s solo. Los expertos han investigado mucho acerca de los cambios en el estilo de vida y las medidas preventivas que muy probablemente preserven su salud. Consulte al mdico para obtener ms informacin. EL PESO Y LA DIETA  Consuma una dieta saludable.  Incluya abundante cantidad de verduras, frutas, productos lcteos descremados y protenas magras.  No coma muchos alimentos con alto contenido de grasas slidas, azcares agregados o sal.  Realice actividad fsica con regularidad. Esta es una de las cosas ms importantes que puede hacer por su salud.  La [employer] de las personas adultas deben hacer actividad fsica durante por lo menos 140mnutos semanales. El ejercicio debe aumentar la frecuencia cardaca y hNature conservation officersudar (ejercicio de intensidad moderada).  Adems, casi todos los adultos deben hacer ejercicios de fortalecimiento al mToysRusveces por semana como complemento del ejercicio de iDante Mantenga un peso saludable.  El ndice de masa corporal ([facility] es una medida que puede usarse para identificar posibles problemas relacionados con el peso. Este ofrece un clculo estimativo de la gAir traffic controlleren funcin del peso y lAgricultural consultant El mdico puede determinar su IEncino Outpatient Surgery Center LLCy ayudarlo a aScience writery mTheatre managerun peso saludable.  Para las mujeres mayores de 20aos:  Un ISurgicare Surgical Associates Of Mahwah LLCmenor de 18,5 se considera bajo peso.  Un IBaptist Health Medical Center - North Little Rockentre 18,5 y 24,9 es normal.  Un ITerrell State Hospitalentre 25 y 29,9 es sobrepeso.  Un IMC de 30 o ms se considera  obesidad. Controlar los niveles de colesterol y lpidos en la sangre  Debe comenzar a hacerse anlisis de sangre para controlar los nAscutneyde lpidos y colesterol a partir de lSed[place] y repetir estos estudios cada 5aos.  Tal vez deba someterse a controles de los niveles de colesterol con ms frecuencia si:  Tiene los niveles de lpidos o colesterol elevados.  Es mayor de 50aos.  Tiene un riesgo alto de tener enfermedades cardacas. DETECCIN DE CNCER  Cncer de pulmn  Se recomienda realizar exmenes de deteccin de cncer de pulmn a las personas adultas que tienen entre 581y 80aos, y corren riesgo de tBest boycncer de pulmn debido a sus antecedentes de tabaquismo.  Se recomienda realizar una tomografa computarizada anual de baja dosis de los pulmones a las personas que:  Siguen fumando.  Hayan dejado de fumar en los ltimos 15aos.  Hayan fumado un paquete diario durante 30aos. El ndice ao-paquete equivale a fumar, en promedio, un paquete de cigarrillos diario durante 1ao.  Debe seguir realizndose estudios de deteccin anual hasta que hayan pasado 15aos desde que dej de fumar.  Estos estudios deben suspenderse si tiene un problema de salud que le impedira recibir tratamiento para eScience writerde pulmn. Cncer de mama  Ponga en prctica la "autoconciencia de las mamas". Esto significa reconocer la apariencia normal de las mamas y cSt. [place]airs[place]  Adems implica realizarse autoexmenes peridicos de lJohnson & Johnson Informe al mdico si hay algn cambio, sin importar cun pequeo sea.  Si tiene entre 20 y 30aos, un mdico debe hacerle un examen clnico de las mamas cada 1 a 316aoscomo parte del  examen habitual de salud.  Si es mayor de 40aos, debe Electrical engineer un examen clnico de las Microsoft. Tambin debe considerar la posibilidad de realizarse una radiografa de las mamas ([place]) todos los [place].  Si tiene antecedentes familiares de cncer de  mama, hable con el mdico para saber si debe someterse a un estudio gentico.  Si tiene un riesgo alto de Animal nutritionist de mama, hable con el mdico para saber si debe hacerse una resonancia magntica y [employer].  Se recomienda una evaluacin del gen del cncer de mama (BRCA) a las mujeres que tengan familiares con tumores malignos relacionados con el BRCA. Los tumores malignos relacionados con elBRCA incluyen:  [employer].  Los [employer].  Los tumores malignos del peritoneo.  Los resultados de la evaluacin determinarn la necesidad de asesoramiento gentico y de [place] de BRCA1 y BRCA2. Cncer de cuello uterino Ya no se recomiendan los exmenes plvicos de rutina para la deteccin del cncer de cuello uterino en las mujeres que no estn embarazadas que son consideradas sujetos de bajo riesgo de Best boy cncer de los rganos de la pelvis (ovarios, tero y vagina) y que no tienen sntomas. Tal vez sea necesario realizar un examen plvico si tiene sntomas, incluidos aquellos que estn asociados con infecciones en la pelvis. Pregntele al mdico si un examen plvico de deteccin es adecuado para usted.   El Papanicolau es la prueba de deteccin del cncer de cuello uterino para las mujeres que podran Engineer, production.  Si le han realizado una histerectoma por un problema que no era cncer u otra enfermedad que podra causar cncer, ya no necesitar realizarse pruebas de Papanicolaou.  Si es mayor de 39aos y los resultados de las pruebas de Papanicolaou han sido normales durante los ltimos 10aos, ya no es necesario que se realice estos estudios.  Si ha recibido un tratamiento para el cncer de cuello uterino o para una enfermedad que podra causar cncer, necesitar realizarse una prueba de Papanicolaou y controles durante al menos 26 aos de concluido el [place].  Si ya no se realiza pruebas de Papanicolaou, debe evaluar sus factores de riesgo si estos  se modifican (por ejemplo, tiene un nuevo compaero sexual). Esta situacin puede influir en la necesidad de que se someta nuevamente a estudios de deteccin.  Algunas mujeres sufren problemas mdicos que aumentan la probabilidad de tener cncer de cuello uterino. En este caso, el mdico podr indicarle que se someta a exmenes de deteccin y pruebas de Papanicolaou con ms frecuencia.  La prueba del virus del Engineer, technical sales (VPH) es un estudio adicional que puede usarse para la deteccin del cncer de cuello uterino. Esta prueba busca la presencia del virus que puede causar cambios celulares en el cuello del tero. Las clulas que se recolectan durante la prueba de Papanicolaou pueden usarse para el VPH.  La prueba del VPH puede usarse para examinar a las [employer] Corporation de 46TKP. Someterse a International aid/development worker del VPH puede prolongar el [employer] las pruebas de Papanicolaou normales de tres a Product manager.  Adems, se debe realizar la prueba del VPH para evaluar a las mujeres de cualquier edad cuyos resultados del Papanicolau no sean claros.  Despus de los 30aos, las mujeres deben realizarse pruebas del VPH con la misma frecuencia que las pruebas de Papanicolau. Cncer colorrectal  Es posible detectar este tipo de cncer y, a menudo, es posible prevenirlo.  Generalmente, los estudios de deteccin de  rutina del cncer colorrectal empiezan a hacerse a partir de los 50aos y continan hasta los 75aos.  El mdico puede recomendar que se los haga antes, si tiene factores de riesgo de cncer de colon.  Adems, el mdico puede recomendar que use un kit de prueba casera para hallar sangre oculta en la materia fecal.  Es posible que se use una pequea cmara en el extremo de un tubo para examinar directamente el colon (sigmoidoscopa o colonoscopa), con el fin de detectar las formas ms incipientes de cncer colorrectal.  Generalmente, los estudios de deteccin de rutina se realizan  a partir de los 50aos.  El examen directo del colon debe repetirse cada 5 a 10aos hasta cumplir 75aos. Sin embargo, tal vez deba someterse a la prueba de deteccin con ms frecuencia si se encuentran formas incipientes de plipos precancerosos o pequeos tumores. Cncer de piel  Revsese la piel peridicamente desde los dedos de los pies hasta la cabeza.  Informe al mdico si aparecen nuevos lunares o si nota cambios en los que ya tiene, especialmente en la forma o el color.  Tambin notifquele si tiene un lunar cuyo tamao es ms grande que el de la goma de un lpiz.  Use siempre pantalla solar. Aplique pantalla solar tantas veces como pueda a lo largo del da.  Protjase usando mangas y pantalones largos, un sombrero de ala ancha y gafas para el sol todo el ao, siempre que est al aire libre. ENFERMEDADES CARDACAS, DIABETES E HIPERTENSIN ARTERIAL   Debe controlarse la presin arterial al menos cada 1 o 2aos. La hipertensin arterial causa enfermedades cardacas y aumenta el riesgo de ictus.  Si tiene entre 55 y 79aos, consulte al mdico si debe tomar aspirina para prevenir ictus.  Hgase anlisis peridicos para la diabetes, que incluyen la toma de una muestra de sangre para controlar el nivel de azcar en la sangre mientras est en ayunas.  Si su peso es normal y tiene un riesgo bajo de tener diabetes, hgase este anlisis una vez cada tres aos, despus de los 45aos.  Si tiene sobrepeso y un riesgo alto de sufrir diabetes, considere la posibilidad de hacerse los anlisis a una edad ms temprana o con ms frecuencia. PREVENCIN DE INFECCIONES  Hepatitis B  Si tiene un riesgo ms alto de tener hepatitisB, debe hacerse anlisis de deteccin de este virus. Se considera que tiene un alto riesgo de hepatitis B si:  Naci en un pas donde la hepatitisB es frecuente. Pregntele al mdico qu pases son considerados de alto riesgo.  Sus padres nacieron en un pas de alto  riesgo, y usted no recibi la vacuna contra la hepatitis B.  Tiene VIH o sida.  Usa agujas para inyectarse drogas.  Convive con una persona que tiene hepatitisB.  Tuvo relaciones sexuales con una persona que tiene hepatitisB.  Recibe tratamiento de hemodilisis.  Toma ciertos medicamentos para cuadros clnicos tales como cncer, trasplante de  

## 2014-02-16 NOTE — Progress Notes (Signed)
Pt is here to establish care. Pt is here to have a Pap smear and physical. Pt has an interpreter.

## 2014-02-20 LAB — CYTOLOGY - PAP

## 2014-02-23 ENCOUNTER — Ambulatory Visit: Payer: No Typology Code available for payment source | Attending: Internal Medicine

## 2014-02-23 DIAGNOSIS — Z124 Encounter for screening for malignant neoplasm of cervix: Secondary | ICD-10-CM

## 2014-02-23 DIAGNOSIS — Z0000 Encounter for general adult medical examination without abnormal findings: Secondary | ICD-10-CM

## 2014-02-23 LAB — CBC
HCT: 40.5 % (ref 36.0–46.0)
Hemoglobin: 13.8 g/dL (ref 12.0–15.0)
MCH: 28.8 pg (ref 26.0–34.0)
MCHC: 34.1 g/dL (ref 30.0–36.0)
MCV: 84.4 fL (ref 78.0–100.0)
PLATELETS: 234 10*3/uL (ref 150–400)
RBC: 4.8 MIL/uL (ref 3.87–5.11)
RDW: 12.9 % (ref 11.5–15.5)
WBC: 5.9 10*3/uL (ref 4.0–10.5)

## 2014-02-23 LAB — HEMOGLOBIN A1C
HEMOGLOBIN A1C: 5.5 % (ref <5.7)
Mean Plasma Glucose: 111 mg/dL (ref <117)

## 2014-02-24 LAB — COMPLETE METABOLIC PANEL WITH GFR
ALK PHOS: 63 U/L (ref 39–117)
ALT: 12 U/L (ref 0–35)
AST: 13 U/L (ref 0–37)
Albumin: 4.2 g/dL (ref 3.5–5.2)
BILIRUBIN TOTAL: 0.6 mg/dL (ref 0.2–1.2)
BUN: 12 mg/dL (ref 6–23)
CO2: 27 meq/L (ref 19–32)
CREATININE: 0.61 mg/dL (ref 0.50–1.10)
Calcium: 9.5 mg/dL (ref 8.4–10.5)
Chloride: 102 mEq/L (ref 96–112)
GFR, Est Non African American: >89 mL/min
Glucose, Bld: 79 mg/dL (ref 70–99)
Potassium: 4.7 mEq/L (ref 3.5–5.3)
SODIUM: 139 meq/L (ref 135–145)
TOTAL PROTEIN: 6.6 g/dL (ref 6.0–8.3)

## 2014-02-24 LAB — TSH: TSH: 1.337 u[IU]/mL (ref 0.350–4.500)

## 2014-02-24 LAB — LIPID PANEL
CHOL/HDL RATIO: 3.9 ratio
Cholesterol: 187 mg/dL (ref 0–200)
HDL: 48 mg/dL (ref >39)
LDL CALC: 125 mg/dL — AB (ref 0–99)
TRIGLYCERIDES: 69 mg/dL (ref <150)
VLDL: 14 mg/dL (ref 0–40)

## 2014-02-24 LAB — HIV ANTIBODY (ROUTINE TESTING W REFLEX): HIV: NONREACTIVE

## 2014-02-24 LAB — RPR

## 2014-02-28 NOTE — Progress Notes (Signed)
Left message informing that pap was normal and to call me back if there is any questions.

## 2014-03-05 ENCOUNTER — Ambulatory Visit: Payer: No Typology Code available for payment source | Attending: Internal Medicine

## 2014-03-06 ENCOUNTER — Encounter: Payer: Self-pay | Admitting: *Deleted

## 2014-03-06 NOTE — Progress Notes (Signed)
Patient presents to walk in clinic with c/o upper back and upper chest pain for 3-4 weeks. Patient denies chest pain radiating anywhere. Patient is not in any respiratory distress. Patient denies any injury. Patient states when she has pain in her upper back she has a tingling sensation and has to breathe harder. Patient given an appointment for 03/12/2014.

## 2014-03-12 ENCOUNTER — Encounter: Payer: Self-pay | Admitting: Internal Medicine

## 2014-03-12 ENCOUNTER — Ambulatory Visit (HOSPITAL_COMMUNITY)
Admission: RE | Admit: 2014-03-12 | Discharge: 2014-03-12 | Disposition: A | Discharge status: Home / Self Care | Payer: Self-pay | Source: Ambulatory Visit | Attending: Internal Medicine | Admitting: Internal Medicine

## 2014-03-12 ENCOUNTER — Ambulatory Visit: Payer: Self-pay | Attending: Internal Medicine | Admitting: Internal Medicine

## 2014-03-12 VITALS — BP 101/69 | HR 67 | Temp 98.0°F | Resp 14 | Ht 60.0 in | Wt 165.0 lb

## 2014-03-12 DIAGNOSIS — F411 Generalized anxiety disorder: Secondary | ICD-10-CM | POA: Insufficient documentation

## 2014-03-12 DIAGNOSIS — M549 Dorsalgia, unspecified: Secondary | ICD-10-CM | POA: Insufficient documentation

## 2014-03-12 DIAGNOSIS — R079 Chest pain, unspecified: Secondary | ICD-10-CM | POA: Insufficient documentation

## 2014-03-12 DIAGNOSIS — Z791 Long term (current) use of non-steroidal anti-inflammatories (NSAID): Secondary | ICD-10-CM | POA: Insufficient documentation

## 2014-03-12 MED ORDER — LORAZEPAM 0.5 MG PO TABS: 0.5000 mg | ORAL_TABLET | Freq: Every day | ORAL | Status: DC | PRN

## 2014-03-12 MED ORDER — SERTRALINE HCL 50 MG PO TABS: 50.0000 mg | ORAL_TABLET | Freq: Every day | ORAL | Status: DC

## 2014-03-12 NOTE — Progress Notes (Signed)
[name], female   DOB: 1981-03-13, 33 y.o.   MRN: [id number]  CC: chest pain and upper back  HPI:  Patient presents today with c/o of chest and upper back pain.  Patient reports that she suffers from anxiety. She reports her chest pain is now happening multiple times per day and is often aggravated by her being upset or frustrated.  She reports that her anxiety makes her want to eat constantly.  She reports that she is getting less sleep and is wakening early.  She reports that she has gained 20 pounds over 2 months.    Allergies  Allergen Reactions  . Penicillins Other (See Comments)    Body goes to sleep   History reviewed. No pertinent past medical history. Current Outpatient Prescriptions on File Prior to Visit  Medication Sig Dispense Refill  . IBUPROFEN PO Take 500 mg by mouth daily as needed (pain).      . naproxen (NAPROSYN) 500 MG tablet Take 1 tablet (500 mg total) by mouth 2 (two) times daily.  30 tablet  0  . traMADol (ULTRAM) 50 MG tablet Take 1 tablet (50 mg total) by mouth every 6 (six) hours as needed.  15 tablet  0   No current facility-administered medications on file prior to visit.   History reviewed. No pertinent family history. History   Social History  . Marital Status: Single    Spouse Name: N/A    Number of Children: N/A  . Years of Education: N/A   Occupational History  . Not on file.   Social History Main Topics  . Smoking status: Never Smoker   . Smokeless tobacco: Not on file  . Alcohol Use: No  . Drug Use: No  . Sexual Activity:    Other Topics Concern  . Not on file   Social History Narrative  . No narrative on file   Review of Systems  Respiratory: Positive for shortness of breath.   Cardiovascular: Positive for chest pain and palpitations.      Objective:   Filed Vitals:   03/12/14 1714  BP: 101/69  Pulse: 67  Temp: 98 F (36.7 C)  Resp: 14    Physical Exam: Constitutional: Patient appears  well-developed and well-nourished. No distress. Neck: Normal ROM. Neck supple. No JVD. No tracheal deviation. No thyromegaly. CVS: RRR, S1/S2 +, no murmurs, no gallops, no carotid bruit.  Pulmonary: Effort and breath sounds normal, no stridor, rhonchi, wheezes, rales.  Abdominal: Soft. BS +,  no distension, tenderness, rebound or guarding.  Musculoskeletal: Normal range of motion. No edema and no tenderness.  Lymphadenopathy: No lymphadenopathy noted, cervical, Neuro: Alert. Normal reflexes, muscle tone coordination.  Skin: Skin is warm and dry. No rash noted. Not diaphoretic. No erythema. No pallor. Psychiatric: Normal mood and affect. Behavior, judgment, thought content normal.  Lab Results  Component Value Date   WBC 5.9 02/23/2014   HGB 13.8 02/23/2014   HCT 40.5 02/23/2014   MCV 84.4 02/23/2014   PLT 234 02/23/2014   Lab Results  Component Value Date   CREATININE 0.61 02/23/2014   BUN 12 02/23/2014   NA 139 02/23/2014   K 4.7 02/23/2014   CL 102 02/23/2014   CO2 27 02/23/2014    Lab Results  Component Value Date   HGBA1C 5.5 02/23/2014   Lipid Panel     Component Value Date/Time   CHOL 187 02/23/2014 1052   TRIG 69 02/23/2014 1052   HDL 48 02/23/2014 1052  CHOLHDL 3.9 02/23/2014 1052   VLDL 14 02/23/2014 1052   LDLCALC 125* 02/23/2014 1052       Assessment and plan:   Karla Reynolds was seen today for back pain and chest pain.  Diagnoses and associated orders for this visit:  Anxiety state, unspecified - sertraline (ZOLOFT) 50 MG tablet; Take 1 tablet (50 mg total) by mouth daily. - LORazepam (ATIVAN) 0.5 MG tablet; Take 1 tablet (0.5 mg total) by mouth daily as needed for anxiety.   Return in about 2 months (around 05/12/2014) for anxiety.        Holland Commons, NP-C [facility] [phone] 03/12/2014, 5:31 PM

## 2014-03-12 NOTE — Progress Notes (Signed)
C/C Chest and back pain, feeling nervous and anxiety for the past month

## 2014-03-13 ENCOUNTER — Telehealth: Payer: Self-pay | Admitting: *Deleted

## 2014-03-13 NOTE — Telephone Encounter (Signed)
Patient notified of lab results and instructions via interpreter [facility]. Discussed exercise and diet changes. Patient states understanding.

## 2014-03-13 NOTE — Telephone Encounter (Signed)
Message copied by Fredderick SeveranceUCATTE, Delora Gravatt L on Tue Mar 13, 2014  2:01 PM ------      Message from: Holland CommonsKECK, VALERIE A      Created: Tue Feb 27, 2014 11:57 AM       Labs are normal with the exception of slightly elevated LDL (bad cholesterol). Please go over diet and lifestyle changes to assist with lower cholesterol. ------

## 2014-06-14 ENCOUNTER — Ambulatory Visit: Payer: Self-pay | Attending: Internal Medicine | Admitting: Internal Medicine

## 2014-06-14 ENCOUNTER — Encounter: Payer: Self-pay | Admitting: Internal Medicine

## 2014-06-14 ENCOUNTER — Ambulatory Visit (HOSPITAL_BASED_OUTPATIENT_CLINIC_OR_DEPARTMENT_OTHER): Payer: Self-pay | Admitting: *Deleted

## 2014-06-14 VITALS — BP 111/75 | HR 66 | Temp 98.1°F | Resp 16 | Ht 60.0 in | Wt 158.0 lb

## 2014-06-14 DIAGNOSIS — Z23 Encounter for immunization: Secondary | ICD-10-CM | POA: Insufficient documentation

## 2014-06-14 DIAGNOSIS — N3000 Acute cystitis without hematuria: Secondary | ICD-10-CM | POA: Insufficient documentation

## 2014-06-14 DIAGNOSIS — Z0000 Encounter for general adult medical examination without abnormal findings: Secondary | ICD-10-CM

## 2014-06-14 DIAGNOSIS — Z79891 Long term (current) use of opiate analgesic: Secondary | ICD-10-CM | POA: Insufficient documentation

## 2014-06-14 DIAGNOSIS — Z791 Long term (current) use of non-steroidal anti-inflammatories (NSAID): Secondary | ICD-10-CM | POA: Insufficient documentation

## 2014-06-14 LAB — POCT URINALYSIS DIPSTICK
BILIRUBIN UA: NEGATIVE
Glucose, UA: NEGATIVE
KETONES UA: NEGATIVE
Nitrite, UA: NEGATIVE
PH UA: 8.5
PROTEIN UA: NEGATIVE
RBC UA: NEGATIVE
SPEC GRAV UA: 1.015
Urobilinogen, UA: 0.2

## 2014-06-14 LAB — POCT URINE PREGNANCY: Preg Test, Ur: NEGATIVE

## 2014-06-14 MED ORDER — PHENAZOPYRIDINE HCL 200 MG PO TABS: 200.0000 mg | ORAL_TABLET | Freq: Three times a day (TID) | ORAL | Status: DC | PRN

## 2014-06-14 MED ORDER — CIPROFLOXACIN HCL 500 MG PO TABS
500.0000 mg | ORAL_TABLET | Freq: Two times a day (BID) | ORAL | Status: DC
Start: 2014-06-14 — End: 2014-09-21

## 2014-06-14 NOTE — Progress Notes (Signed)
Pt is here today c/o pain in her abdomen and back for 12 days. Pt states that when she urinates it burns.

## 2014-06-14 NOTE — Progress Notes (Signed)
[name], female   DOB: 1980/10/24, 33 y.o.   MRN: [id number]  CC: burning with urination  HPI:  Patient presents with c/o burning with urination.  She reports symptoms of dysuria, frequency, pelvic pain, flank pain, chills, and headaches.  She denies nausea, vomiting, fevers, vaginal discharge, itch, odor, or lesions.  No new sexual partners.  LMP 10/1, is late for November cycle.  Cycles are usually regular and on time.   Allergies  Allergen Reactions  . Penicillins Other (See Comments)    Body goes to sleep   History reviewed. No pertinent past medical history. Current Outpatient Prescriptions on File Prior to Visit  Medication Sig Dispense Refill  . IBUPROFEN PO Take 500 mg by mouth daily as needed (pain).    . LORazepam (ATIVAN) 0.5 MG tablet Take 1 tablet (0.5 mg total) by mouth daily as needed for anxiety. 10 tablet 1  . naproxen (NAPROSYN) 500 MG tablet Take 1 tablet (500 mg total) by mouth 2 (two) times daily. 30 tablet 0  . sertraline (ZOLOFT) 50 MG tablet Take 1 tablet (50 mg total) by mouth daily. 30 tablet 3  . traMADol (ULTRAM) 50 MG tablet Take 1 tablet (50 mg total) by mouth every 6 (six) hours as needed. 15 tablet 0   No current facility-administered medications on file prior to visit.   History reviewed. No pertinent family history. History   Social History  . Marital Status: Single    Spouse Name: N/A    Number of Children: N/A  . Years of Education: N/A   Occupational History  . Not on file.   Social History Main Topics  . Smoking status: Never Smoker   . Smokeless tobacco: Not on file  . Alcohol Use: No  . Drug Use: No  . Sexual Activity: Not on file   Other Topics Concern  . Not on file   Social History Narrative    Review of Systems: See HPI   Objective:   Filed Vitals:   06/14/14 1113  BP: 111/75  Pulse: 66  Temp: 98.1 F (36.7 C)  Resp: 16    Physical Exam  Constitutional: She is oriented to person, place,  and time.  Cardiovascular: Normal rate, regular rhythm and normal heart sounds.   Pulmonary/Chest: Effort normal and breath sounds normal.  Abdominal: Soft. Bowel sounds are normal. There is tenderness.  Negative CVA tenderness   Neurological: She is alert and oriented to person, place, and time.  Skin: Skin is warm and dry.  Psychiatric: She has a normal mood and affect.     Lab Results  Component Value Date   WBC 5.9 02/23/2014   HGB 13.8 02/23/2014   HCT 40.5 02/23/2014   MCV 84.4 02/23/2014   PLT 234 02/23/2014   Lab Results  Component Value Date   CREATININE 0.61 02/23/2014   BUN 12 02/23/2014   NA 139 02/23/2014   K 4.7 02/23/2014   CL 102 02/23/2014   CO2 27 02/23/2014    Lab Results  Component Value Date   HGBA1C 5.5 02/23/2014   Lipid Panel     Component Value Date/Time   CHOL 187 02/23/2014 1052   TRIG 69 02/23/2014 1052   HDL 48 02/23/2014 1052   CHOLHDL 3.9 02/23/2014 1052   VLDL 14 02/23/2014 1052   LDLCALC 125* 02/23/2014 1052       Assessment and plan:   Anslee was seen today for follow-up.  Diagnoses and associated orders for this  visit:  Acute cystitis without hematuria - Urinalysis Dipstick - Urine culture - ciprofloxacin (CIPRO) 500 MG tablet; Take 1 tablet (500 mg total) by mouth 2 (two) times daily. - phenazopyridine (PYRIDIUM) 200 MG tablet; Take 1 tablet (200 mg total) by mouth 3 (three) times daily as needed for pain.  Preventative health care - POCT urine pregnancy  Need for influenza vaccination Influenza injection received.  Explained side effects and contraindications to patient. Information sheet given to patient.    Return if symptoms worsen or fail to improve.       Holland CommonsKECK, VALERIE, NP-C [facility] [phone] 06/14/2014, 12:17 PM

## 2014-06-14 NOTE — Patient Instructions (Signed)
Infección urinaria  °(Urinary Tract Infection) ° La infección urinaria puede ocurrir en cualquier lugar del tracto urinario. El tracto urinario es un sistema de drenaje del cuerpo por el que se eliminan los desechos y el exceso de agua. El tracto urinario está formado por dos riñones, dos uréteres, la vejiga y la uretra. Los riñones son órganos que tienen forma de frijol. Cada riñón tiene aproximadamente el tamaño del puño. Están situados debajo de las costillas, uno a cada lado de la columna vertebral °CAUSAS  °La causa de la infección son los microbios, que son organismos microscópicos, que incluyen hongos, virus, y bacterias. Estos organismos son tan pequeños que sólo pueden verse a través del microscopio. Las bacterias son los microorganismos que más comúnmente causan infecciones urinarias.  °SÍNTOMAS  °Los síntomas pueden variar según la edad y el sexo del paciente y por la ubicación de la infección. Los síntomas en las mujeres jóvenes incluyen la necesidad frecuente e intensa de orinar y una sensación dolorosa de ardor en la vejiga o en la uretra durante la micción. Las mujeres y los hombres mayores podrán sentir cansancio, temblores y debilidad y sentir dolores musculares y dolor abdominal. Si tiene fiebre, puede significar que la infección está en los riñones. Otros síntomas son dolor en la espalda o en los lados debajo de las costillas, náuseas y vómitos.  °DIAGNÓSTICO  °Para diagnosticar una infección urinaria, el médico le preguntará acerca de sus síntomas. También le solicitará una muestra de orina. La muestra de orina se analiza para detectar bacterias y glóbulos blancos de la sangre. Los glóbulos blancos se forman en el organismo para ayudar a combatir las infecciones.  °TRATAMIENTO  °Por lo general, las infecciones urinarias pueden tratarse con medicamentos. Debido a que la mayoría de las infecciones son causadas por bacterias, por lo general pueden tratarse con antibióticos. La elección del  antibiótico y la duración del tratamiento dependerá de sus síntomas y el tipo de bacteria causante de la infección.  °INSTRUCCIONES PARA EL CUIDADO EN EL HOGAR  °· Si le recetaron antibióticos, tómelos exactamente como su médico le indique. Termine el medicamento aunque se sienta mejor después de haber tomado sólo algunos. °· Beba gran cantidad de líquido para mantener la orina de tono claro o color amarillo pálido. °· Evite la cafeína, el té y las bebidas gaseosas. Estas sustancias irritan la vejiga. °· Vaciar la vejiga con frecuencia. Evite retener la orina durante largos períodos. °· Vacíe la vejiga antes y después de tener relaciones sexuales. °· Después de mover el intestino, las mujeres deben higienizarse la región perineal desde adelante hacia atrás. Use sólo un papel tissue por vez. °SOLICITE ATENCIÓN MÉDICA SI:  °· Siente dolor en la espalda. °· Le sube la fiebre. °· Los síntomas no mejoran luego de 3 días. °SOLICITE ATENCIÓN MÉDICA DE INMEDIATO SI:  °· Siente dolor intenso en la espalda o en la zona inferior del abdomen. °· Comienza a sentir escalofríos. °· Tiene náuseas o vómitos. °· Tiene una sensación continua de quemazón o molestias al orinar. °ASEGÚRESE DE QUE:  °· Comprende estas instrucciones. °· Controlará su enfermedad. °· Solicitará ayuda de inmediato si no mejora o empeora. °Document Released: 04/22/2005 Document Revised: 04/06/2012 °ExitCare® Patient Information ©2015 [employer]. This information is not intended to replace advice given to you by your health care provider. Make sure you discuss any questions you have with your health care provider. ° °

## 2014-06-16 LAB — URINE CULTURE
Colony Count: NO GROWTH
ORGANISM ID, BACTERIA: NO GROWTH

## 2014-09-11 ENCOUNTER — Ambulatory Visit: Payer: Self-pay | Admitting: Internal Medicine

## 2014-09-17 ENCOUNTER — Ambulatory Visit: Payer: Self-pay | Admitting: Internal Medicine

## 2014-09-21 ENCOUNTER — Ambulatory Visit: Payer: Self-pay | Attending: Internal Medicine | Admitting: Internal Medicine

## 2014-09-21 ENCOUNTER — Encounter: Payer: Self-pay | Admitting: Internal Medicine

## 2014-09-21 VITALS — BP 110/70 | HR 68 | Temp 98.1°F | Resp 16 | Ht 60.0 in | Wt 166.0 lb

## 2014-09-21 DIAGNOSIS — R1906 Epigastric swelling, mass or lump: Secondary | ICD-10-CM

## 2014-09-21 DIAGNOSIS — R1013 Epigastric pain: Secondary | ICD-10-CM | POA: Insufficient documentation

## 2014-09-21 DIAGNOSIS — K219 Gastro-esophageal reflux disease without esophagitis: Secondary | ICD-10-CM

## 2014-09-21 LAB — POCT URINALYSIS DIPSTICK
Bilirubin, UA: NEGATIVE
Blood, UA: NEGATIVE
Glucose, UA: NEGATIVE
KETONES UA: NEGATIVE
Leukocytes, UA: NEGATIVE
Nitrite, UA: NEGATIVE
PH UA: 6
PROTEIN UA: NEGATIVE
Urobilinogen, UA: 0.2

## 2014-09-21 MED ORDER — OMEPRAZOLE 20 MG PO CPDR: 20.0000 mg | DELAYED_RELEASE_CAPSULE | Freq: Every day | ORAL | Status: DC

## 2014-09-21 NOTE — Patient Instructions (Signed)
Reflujo gastroesofágico - Adultos  °(Gastroesophageal Reflux Disease, Adult) ° El reflujo gastroesofágico ocurre cuando el ácido del estómago pasa al esófago. Cuando el ácido entra en contacto con el esófago, el ácido provoca dolor (inflamación) en el esófago. Con el tiempo, pueden formarse pequeños agujeros (úlceras) en el revestimiento del esófago. °CAUSAS  °· Exceso de peso corporal. Esto aplica presión sobre el estómago, lo que hace que el ácido del estómago suba hacia el esófago. °· El hábito de fumar Aumenta la producción de ácido en el estómago. °· El consumo de alcohol. Provoca disminución de la presión en el esfínter esofágico inferior (válvula o anillo de músculo entre el esófago y el estómago), permitiendo que el ácido del estómago suba hacia el esófago. °· Cenas a última hora del día y estómago lleno. Aumenta la presión y la producción de ácido en el estómago. °· Malformación en el esfínter esofágico inferior. °A menudo no se halla causa.  °SÍNTOMAS  °· Ardor y dolor en la parte inferior del pecho detrás del esternón y en la zona media del estómago. Puede ocurrir dos veces por semana o más a menudo. °· Dificultad para tragar. °· Dolor de garganta. °· Tos seca. °· Síntomas similares al asma que incluyen sensación de opresión en el pecho, falta de aire y sibilancias. °DIAGNÓSTICO  °El médico diagnosticará el problema basándose en los síntomas. En algunos casos, se indican radiografías y otras pruebas para verificar si hay complicaciones o para comprobar el estado del estómago y el esófago.  °TRATAMIENTO  °El médico le indicará medicamentos de venta libre o recetados para ayudar a disminuir la producción de ácido. Consulte con su médico antes de empezar o agregar cualquier medicamento nuevo.  °INSTRUCCIONES PARA EL CUIDADO EN EL HOGAR  °· Modifique los factores que pueda cambiar. Consulte con su médico para solicitar orientación relacionada con la pérdida de peso, dejar de fumar y el consumo de  alcohol. °· Evite las comidas y bebidas que empeoran los problemas, como: °¨ Bebidas con cafeína o alcohólicas. °¨ Chocolate. °¨ Sabores a menta. °¨ Ajo y cebolla. °¨ Comidas muy condimentadas. °¨ Cítricos como naranjas, limones o limas. °¨ Alimentos que contengan tomate, como salsas, chile y pizza. °¨ Alimentos fritos y grasos. °· Evite acostarse durante 3 horas antes de irse a dormir o antes de tomar una siesta. °· Haga comidas pequeñas durante el día en lugar de 3 comidas abundantes. °· Use ropas sueltas. No use nada apretado alrededor de la cintura que cause presión en el estómago. °· Levante (eleve) la cabecera de la cama 6 a 8 pulgadas (15 a 20 cm) con bloques de madera. Usar almohadas extra no ayuda. °· Solo tome medicamentos que se pueden comprar sin receta o recetados para el dolor, malestar o fiebre, como le indica el médico. °· No tome aspirina, ibuprofeno ni antiinflamatorios no esteroides. °SOLICITE ATENCIÓN MÉDICA DE INMEDIATO SI:  °· Siente dolor en los brazos, el cuello, la mandíbula, los dientes o la espalda. °· El dolor aumenta o cambia la intensidad o la duranción. °· Tiene náuseas, vómitos o sudoración(diaforesis). °· Siente falta de aire o dolor en el pecho, o se desmaya. °· Vomita y el vómito tiene sangre, es de color verde, amarillo, negro o es similar a la borra del café o tiene sangre. °· Las heces son rojas, sanguinolentas o negras. °Estos síntomas pueden ser signos de otros problemas, como enfermedades cardíacas, hemorragias gástrias o sangrado esofágico.  °ASEGÚRESE DE QUE:  °· Comprende estas instrucciones. °· Controlará su enfermedad. °·   Solicitará ayuda de inmediato si no mejora o si empeora. °Document Released: 04/22/2005 Document Revised: 10/05/2011 °ExitCare® Patient Information ©2015 [employer]. This information is not intended to replace advice given to you by your health care provider. Make sure you discuss any questions you have with your health care provider. ° °

## 2014-09-21 NOTE — Progress Notes (Signed)
   Subjective:    [name], female    DOB: [birth year], 34 y.o.   MRN: [id number]  Abdominal Pain This is a new problem. The current episode started 1 to 4 weeks ago. The onset quality is gradual. The problem occurs daily. The pain is located in the epigastric region. The pain is mild. The quality of the pain is a sensation of fullness. The abdominal pain radiates to the epigastric region. Associated symptoms include belching and nausea. Pertinent negatives include no anorexia, constipation, diarrhea, fever, vomiting or weight loss. Associated symptoms comments: bloated. The pain is aggravated by eating (eating heavy meals). The pain is relieved by nothing. She has tried nothing for the symptoms. There is no history of GERD.      Review of Systems  Constitutional: Negative for fever and weight loss.  Gastrointestinal: Positive for nausea and abdominal pain. Negative for vomiting, diarrhea, constipation and anorexia.       Objective:   Physical Exam  Cardiovascular: Normal rate, regular rhythm and normal heart sounds.   Pulmonary/Chest: Effort normal and breath sounds normal.  Abdominal: Soft. Bowel sounds are normal. She exhibits mass (epigastric region). There is tenderness.      Assessment & Plan:  [name] was seen today for follow-up.  Diagnoses and all orders for this visit:  Abdominal wall mass of epigastric region Orders: -     Urinalysis Dipstick -     US Abdomen Limited; Future  Gastroesophageal reflux disease, esophagitis presence not specified Orders: -     Begin omeprazole (PRILOSEC) 20 MG capsule; Take 1 capsule (20 mg total) by mouth daily. Patients stomach bloating likely due to acid reflux  Due to language barrier, an interpreter was present during the history-taking and subsequent discussion (and for part of the physical exam) with this patient.  Return for pendong ultrasound results.  Holland CommonsKECK, VALERIE, NP 10/09/2014 11:33 AM

## 2014-09-21 NOTE — Progress Notes (Signed)
For 3 weeks pt has been feeling pain in her stomach and she she eats her stomach gets big and tight. Pt has an interpreter.

## 2014-09-28 ENCOUNTER — Telehealth: Payer: Self-pay | Admitting: Emergency Medicine

## 2014-09-28 ENCOUNTER — Ambulatory Visit (HOSPITAL_COMMUNITY)
Admission: RE | Admit: 2014-09-28 | Discharge: 2014-09-28 | Disposition: A | Discharge status: Home / Self Care | Payer: Self-pay | Source: Ambulatory Visit | Attending: Internal Medicine | Admitting: Internal Medicine

## 2014-09-28 ENCOUNTER — Other Ambulatory Visit: Payer: Self-pay | Admitting: Internal Medicine

## 2014-09-28 DIAGNOSIS — R1906 Epigastric swelling, mass or lump: Secondary | ICD-10-CM | POA: Insufficient documentation

## 2014-09-28 DIAGNOSIS — R1900 Intra-abdominal and pelvic swelling, mass and lump, unspecified site: Secondary | ICD-10-CM

## 2014-09-28 NOTE — Telephone Encounter (Signed)
-----   Message from Ambrose FinlandValerie A Keck, NP sent at 09/28/2014 10:06 AM EST ----- Please call patient and let her know that it is her xiphoid process that we felt. Usually it does not stick out that far and we need to do additional imaging to find out why it is protruding out so far. I will place order for CT scan, please schedule for patient

## 2014-10-01 ENCOUNTER — Ambulatory Visit (HOSPITAL_COMMUNITY): Admission: RE | Admit: 2014-10-01 | Payer: Self-pay | Source: Ambulatory Visit

## 2014-10-15 ENCOUNTER — Ambulatory Visit: Payer: Self-pay

## 2015-03-18 ENCOUNTER — Emergency Department (INDEPENDENT_AMBULATORY_CARE_PROVIDER_SITE_OTHER)
Admission: EM | Admit: 2015-03-18 | Discharge: 2015-03-18 | Disposition: A | Discharge status: Home / Self Care | Payer: Self-pay | Source: Home / Self Care | Attending: Emergency Medicine | Admitting: Emergency Medicine

## 2015-03-18 ENCOUNTER — Encounter (HOSPITAL_COMMUNITY): Payer: Self-pay | Admitting: Emergency Medicine

## 2015-03-18 DIAGNOSIS — B9681 Helicobacter pylori [H. pylori] as the cause of diseases classified elsewhere: Secondary | ICD-10-CM

## 2015-03-18 DIAGNOSIS — K219 Gastro-esophageal reflux disease without esophagitis: Secondary | ICD-10-CM

## 2015-03-18 DIAGNOSIS — Z331 Pregnant state, incidental: Secondary | ICD-10-CM

## 2015-03-18 DIAGNOSIS — Z3490 Encounter for supervision of normal pregnancy, unspecified, unspecified trimester: Secondary | ICD-10-CM

## 2015-03-18 DIAGNOSIS — Z3201 Encounter for pregnancy test, result positive: Secondary | ICD-10-CM

## 2015-03-18 DIAGNOSIS — K2970 Gastritis, unspecified, without bleeding: Principal | ICD-10-CM

## 2015-03-18 LAB — POCT H PYLORI SCREEN: H. PYLORI SCREEN, POC: POSITIVE — AB

## 2015-03-18 LAB — POCT PREGNANCY, URINE: Preg Test, Ur: POSITIVE — AB

## 2015-03-18 MED ORDER — ONDANSETRON 4 MG PO TBDP
4.0000 mg | ORAL_TABLET | Freq: Once | ORAL | Status: AC
  Administered 2015-03-18: 4 mg via ORAL

## 2015-03-18 MED ORDER — GI COCKTAIL ~~LOC~~
ORAL | Status: AC
  Filled 2015-03-18: qty 30

## 2015-03-18 MED ORDER — ONDANSETRON HCL 4 MG PO TABS: 4.0000 mg | ORAL_TABLET | Freq: Three times a day (TID) | ORAL | Status: DC | PRN

## 2015-03-18 MED ORDER — GI COCKTAIL ~~LOC~~
30.0000 mL | Freq: Once | ORAL | Status: AC
  Administered 2015-03-18: 30 mL via ORAL

## 2015-03-18 MED ORDER — ONDANSETRON 4 MG PO TBDP
ORAL_TABLET | ORAL | Status: AC
  Filled 2015-03-18: qty 1

## 2015-03-18 MED ORDER — OMEPRAZOLE 40 MG PO CPDR: 40.0000 mg | DELAYED_RELEASE_CAPSULE | Freq: Every day | ORAL | Status: DC

## 2015-03-18 NOTE — ED Provider Notes (Signed)
CSN: [id number]     Arrival date & time 03/18/15  1527 History   First MD Initiated Contact with Patient 03/18/15 1623     Chief Complaint  Patient presents with  . Nausea  . Fever  . Abdominal Pain   (Consider location/radiation/quality/duration/timing/severity/associated sxs/prior Treatment) HPI She is a 34 year old woman here for evaluation of nausea and abdominal pain. She states this started about 2 weeks ago and has gradually been getting worse. She states she is unable to eat or drink anything due to nausea. The pain is mostly in her upper abdomen. Nothing makes it better or worse. She denies any vomiting. No diarrhea. She reports subjective fevers, but has not taken her temperature. She also reports feeling weak and shaky.  History reviewed. No pertinent past medical history. History reviewed. No pertinent past surgical history. History reviewed. No pertinent family history. Social History  Substance Use Topics  . Smoking status: Never Smoker   . Smokeless tobacco: None  . Alcohol Use: No   OB History    No data available     Review of Systems As in history of present illness Allergies  Penicillins  Home Medications   Prior to Admission medications   Medication Sig Start Date End Date Taking? Authorizing Provider  IBUPROFEN PO Take 500 mg by mouth daily as needed (pain).    Historical Provider, MD  omeprazole (PRILOSEC) 40 MG capsule Take 1 capsule (40 mg total) by mouth daily. 03/18/15   Charm Rings, MD  ondansetron (ZOFRAN) 4 MG tablet Take 1 tablet (4 mg total) by mouth every 8 (eight) hours as needed for nausea or vomiting. 03/18/15   Charm Rings, MD   BP 101/70 mmHg  Pulse 60  Temp(Src) 98.4 F (36.9 C) (Oral)  Resp 12  SpO2 100%  LMP 01/29/2015 (Exact Date) Physical Exam  Constitutional: She is oriented to person, place, and time. She appears well-developed and well-nourished. No distress.  Neck: Neck supple.  Cardiovascular: Normal rate, regular rhythm  and normal heart sounds.   No murmur heard. Pulmonary/Chest: Effort normal and breath sounds normal. No respiratory distress. She has no wheezes. She has no rales.  Abdominal: Soft. Bowel sounds are normal. She exhibits no distension and no mass. There is tenderness (diffuse tenderness). There is no rebound and no guarding.  Neurological: She is alert and oriented to person, place, and time.    ED Course  Procedures (including critical care time) Labs Review Labs Reviewed  POCT PREGNANCY, URINE - Abnormal; Notable for the following:    Preg Test, Ur POSITIVE (*)    All other components within normal limits  POCT H PYLORI SCREEN - Abnormal; Notable for the following:    H. PYLORI SCREEN, POC POSITIVE (*)    All other components within normal limits    Imaging Review No results found.   MDM   1. Helicobacter pylori gastritis   2. Pregnancy   3. Gastroesophageal reflux disease, esophagitis presence not specified    Zofran 4 mg ODT given as well as a GI cocktail. She reports improvement in her pain and nausea after these medications.  Will treat with omeprazole 40 mg daily and Zofran 4 mg as needed. I will defer to her OB/GYN for H. pylori eradication therapy given that she is penicillin allergic and many of the medications for eradication are not recommended for use in pregnancy. She will follow-up with her OB on Friday.    Charm Rings, MD 03/18/15 [phone]

## 2015-03-18 NOTE — ED Notes (Signed)
Pt has been suffering from nausea and mid upper abdominal pain for two weeks.  She states she feels something hard in there.  She has been having chills and she has been very weak and shaky.

## 2015-03-18 NOTE — Discharge Instructions (Signed)
Your pregnancy test is positive. Please follow-up with your OB doctor on Friday as scheduled.  You also have H. pylori in your stomach. This is an infection. Take omeprazole daily to help with the pain. Use the Zofran every 8 hours as needed for nausea. Please let your OB doctor know about the H. pylori so he can determine the best route of treatment.

## 2015-03-22 ENCOUNTER — Encounter: Payer: Self-pay | Admitting: Internal Medicine

## 2015-03-22 ENCOUNTER — Ambulatory Visit: Payer: Self-pay | Attending: Internal Medicine | Admitting: Internal Medicine

## 2015-03-22 VITALS — BP 112/70 | HR 74 | Temp 98.0°F | Resp 16 | Ht 60.0 in | Wt 169.8 lb

## 2015-03-22 DIAGNOSIS — Z331 Pregnant state, incidental: Secondary | ICD-10-CM

## 2015-03-22 DIAGNOSIS — B9681 Helicobacter pylori [H. pylori] as the cause of diseases classified elsewhere: Secondary | ICD-10-CM

## 2015-03-22 DIAGNOSIS — Z3490 Encounter for supervision of normal pregnancy, unspecified, unspecified trimester: Secondary | ICD-10-CM

## 2015-03-22 DIAGNOSIS — A048 Other specified bacterial intestinal infections: Secondary | ICD-10-CM

## 2015-03-22 NOTE — Progress Notes (Signed)
[name], female   DOB: May 25, 1981, 34 y.o.   MRN: [id number]  CC: h.pylori, pregnancy  HPI: Karla Reynolds is a 34 y.o. female here today for a follow up visit.  Patient has no past medical history. Patient reports that she was recently seen in ED for abdominal pain and was diagnosed with H. Pylori and pregnancy. Her LMP was July 5th. She reports that the ER did not give her antibiotics for her infections due to the positive pregnancy test. She has not applied for medicaid yet.      Allergies  Allergen Reactions  . Penicillins Other (See Comments)    Body goes to sleep   History reviewed. No pertinent past medical history. Current Outpatient Prescriptions on File Prior to Visit  Medication Sig Dispense Refill  . omeprazole (PRILOSEC) 40 MG capsule Take 1 capsule (40 mg total) by mouth daily. 30 capsule 0  . ondansetron (ZOFRAN) 4 MG tablet Take 1 tablet (4 mg total) by mouth every 8 (eight) hours as needed for nausea or vomiting. 20 tablet 0  . IBUPROFEN PO Take 500 mg by mouth daily as needed (pain).    . [DISCONTINUED] sertraline (ZOLOFT) 50 MG tablet Take 1 tablet (50 mg total) by mouth daily. (Patient not taking: Reported on 09/21/2014) 30 tablet 3   No current facility-administered medications on file prior to visit.   History reviewed. No pertinent family history. Social History   Social History  . Marital Status: Single    Spouse Name: N/A  . Number of Children: N/A  . Years of Education: N/A   Occupational History  . Not on file.   Social History Main Topics  . Smoking status: Never Smoker   . Smokeless tobacco: Not on file  . Alcohol Use: No  . Drug Use: No  . Sexual Activity: Not on file   Other Topics Concern  . Not on file   Social History Narrative    Review of Systems  Gastrointestinal: Positive for heartburn, nausea and abdominal pain. Negative for vomiting.  All other systems reviewed and are negative.  Objective:    Filed Vitals:   03/22/15 1622  BP: 112/70  Pulse: 74  Temp: 98 F (36.7 C)  Resp: 16    Physical Exam  Cardiovascular: Normal rate, regular rhythm and normal heart sounds.   Pulmonary/Chest: Effort normal and breath sounds normal.  Abdominal: She exhibits no distension. There is no tenderness.   Lab Results  Component Value Date   WBC 5.9 02/23/2014   HGB 13.8 02/23/2014   HCT 40.5 02/23/2014   MCV 84.4 02/23/2014   PLT 234 02/23/2014   Lab Results  Component Value Date   CREATININE 0.61 02/23/2014   BUN 12 02/23/2014   NA 139 02/23/2014   K 4.7 02/23/2014   CL 102 02/23/2014   CO2 27 02/23/2014    Lab Results  Component Value Date   HGBA1C 5.5 02/23/2014   Lipid Panel     Component Value Date/Time   CHOL 187 02/23/2014 1052   TRIG 69 02/23/2014 1052   HDL 48 02/23/2014 1052   CHOLHDL 3.9 02/23/2014 1052   VLDL 14 02/23/2014 1052   LDLCALC 125* 02/23/2014 1052       Assessment and plan:   Karla Reynolds was seen today for follow-up.  Diagnoses and all orders for this visit:  H. pylori infection -     Ambulatory referral to Obstetrics / Gynecology Patient has penicillin allergy and all other medications  are a category C. I will defer treatment to Obstetrics.  Explained signs and symptoms that should warrant immediate attention.  Patient verbalized understanding with teach back used.   Pregnancy Advised patient to get prenatal vitamins and begin today. I will refer patient to GYN.   Interpreter was used to communicate directly with patient for the entire encounter including providing detailed patient instructions.   Return if symptoms worsen or fail to improve.         Ambrose Finland, NP-C [facility] [phone] 03/22/2015, 4:53 PM

## 2015-03-22 NOTE — Patient Instructions (Addendum)
[facility] Address: [address] [place], [place] [address] [phone]  Go here to apply for pregnancy medicaid

## 2015-03-22 NOTE — Progress Notes (Signed)
Patient here for follow up from the ED Patient was seen for fever, abd pain and generally not feeling well Patient was diagnosed with h pylori gastritis Positive pregnancy And reflux disease

## 2015-04-01 ENCOUNTER — Encounter (HOSPITAL_COMMUNITY): Payer: Self-pay | Admitting: Vascular Surgery

## 2015-04-01 ENCOUNTER — Emergency Department (HOSPITAL_COMMUNITY)
Admission: EM | Admit: 2015-04-01 | Discharge: 2015-04-01 | Disposition: A | Discharge status: Home / Self Care | Payer: Self-pay | Attending: Emergency Medicine | Admitting: Emergency Medicine

## 2015-04-01 DIAGNOSIS — M545 Low back pain: Secondary | ICD-10-CM | POA: Insufficient documentation

## 2015-04-01 DIAGNOSIS — Z79899 Other long term (current) drug therapy: Secondary | ICD-10-CM | POA: Insufficient documentation

## 2015-04-01 DIAGNOSIS — R51 Headache: Secondary | ICD-10-CM | POA: Insufficient documentation

## 2015-04-01 DIAGNOSIS — R509 Fever, unspecified: Secondary | ICD-10-CM | POA: Insufficient documentation

## 2015-04-01 DIAGNOSIS — M546 Pain in thoracic spine: Secondary | ICD-10-CM | POA: Insufficient documentation

## 2015-04-01 DIAGNOSIS — N898 Other specified noninflammatory disorders of vagina: Secondary | ICD-10-CM | POA: Insufficient documentation

## 2015-04-01 DIAGNOSIS — R300 Dysuria: Secondary | ICD-10-CM | POA: Insufficient documentation

## 2015-04-01 DIAGNOSIS — R5383 Other fatigue: Secondary | ICD-10-CM | POA: Insufficient documentation

## 2015-04-01 DIAGNOSIS — Z3A08 8 weeks gestation of pregnancy: Secondary | ICD-10-CM | POA: Insufficient documentation

## 2015-04-01 DIAGNOSIS — R531 Weakness: Secondary | ICD-10-CM | POA: Insufficient documentation

## 2015-04-01 DIAGNOSIS — K5900 Constipation, unspecified: Secondary | ICD-10-CM | POA: Insufficient documentation

## 2015-04-01 DIAGNOSIS — O2631 Retained intrauterine contraceptive device in pregnancy, first trimester: Secondary | ICD-10-CM | POA: Insufficient documentation

## 2015-04-01 DIAGNOSIS — R109 Unspecified abdominal pain: Secondary | ICD-10-CM

## 2015-04-01 DIAGNOSIS — Z3490 Encounter for supervision of normal pregnancy, unspecified, unspecified trimester: Secondary | ICD-10-CM

## 2015-04-01 LAB — COMPREHENSIVE METABOLIC PANEL
ALBUMIN: 3.4 g/dL — AB (ref 3.5–5.0)
ALK PHOS: 40 U/L (ref 38–126)
ALT: 13 U/L — ABNORMAL LOW (ref 14–54)
ANION GAP: 9 (ref 5–15)
AST: 16 U/L (ref 15–41)
BUN: 9 mg/dL (ref 6–20)
CALCIUM: 8.9 mg/dL (ref 8.9–10.3)
CO2: 22 mmol/L (ref 22–32)
Chloride: 103 mmol/L (ref 101–111)
Creatinine, Ser: 0.54 mg/dL (ref 0.44–1.00)
GFR calc Af Amer: >60 mL/min (ref >60)
Glucose, Bld: 94 mg/dL (ref 65–99)
Potassium: 3.4 mmol/L — ABNORMAL LOW (ref 3.5–5.1)
Sodium: 134 mmol/L — ABNORMAL LOW (ref 135–145)
TOTAL PROTEIN: 6.7 g/dL (ref 6.5–8.1)
Total Bilirubin: 0.2 mg/dL — ABNORMAL LOW (ref 0.3–1.2)

## 2015-04-01 LAB — URINE MICROSCOPIC-ADD ON

## 2015-04-01 LAB — URINALYSIS, ROUTINE W REFLEX MICROSCOPIC
Bilirubin Urine: NEGATIVE
Glucose, UA: NEGATIVE mg/dL
Ketones, ur: NEGATIVE mg/dL
Leukocytes, UA: NEGATIVE
Nitrite: NEGATIVE
PROTEIN: NEGATIVE mg/dL
Specific Gravity, Urine: 1.02 (ref 1.005–1.030)
UROBILINOGEN UA: 0.2 mg/dL (ref 0.0–1.0)
pH: 5.5 (ref 5.0–8.0)

## 2015-04-01 LAB — CBC
HEMATOCRIT: 36.1 % (ref 36.0–46.0)
Hemoglobin: 12.3 g/dL (ref 12.0–15.0)
MCH: 28.9 pg (ref 26.0–34.0)
MCHC: 34.1 g/dL (ref 30.0–36.0)
MCV: 84.7 fL (ref 78.0–100.0)
Platelets: 223 10*3/uL (ref 150–400)
RBC: 4.26 MIL/uL (ref 3.87–5.11)
RDW: 12.5 % (ref 11.5–15.5)
WBC: 8.8 10*3/uL (ref 4.0–10.5)

## 2015-04-01 LAB — HCG, QUANTITATIVE, PREGNANCY: HCG, BETA CHAIN, QUANT, S: 145286 m[IU]/mL — AB (ref <5)

## 2015-04-01 LAB — WET PREP, GENITAL
Trich, Wet Prep: NONE SEEN
Yeast Wet Prep HPF POC: NONE SEEN

## 2015-04-01 LAB — LIPASE, BLOOD: Lipase: 43 U/L (ref 22–51)

## 2015-04-01 MED ORDER — ACETAMINOPHEN 325 MG PO TABS
650.0000 mg | ORAL_TABLET | Freq: Once | ORAL | Status: AC
  Administered 2015-04-01: 650 mg via ORAL
  Filled 2015-04-01: qty 2

## 2015-04-01 MED ORDER — ACETAMINOPHEN 500 MG PO TABS: 500.0000 mg | ORAL_TABLET | Freq: Four times a day (QID) | ORAL | Status: DC | PRN

## 2015-04-01 MED ORDER — MAGNESIUM HYDROXIDE 400 MG/5ML PO SUSP: 30.0000 mL | Freq: Every evening | ORAL | Status: DC | PRN

## 2015-04-01 NOTE — ED Provider Notes (Signed)
CSN: [id number]     Arrival date & time 04/01/15  1842 History   First MD Initiated Contact with Patient 04/01/15 1958     Chief Complaint  Patient presents with  . Constipation     HPI   Karla Reynolds is a 34 y.o. female with no pertinent PMH who presents to the ED with back pain, abdominal bloating, subjective fever, nausea, dysuria x 1 month, and white vaginal discharge x 6 days. She states she is [redacted] weeks pregnant. She reports her symptoms are constant. She is unable to identify anything that precipitates her symptoms. She has not tried anything for symptom relief. Reports her last BM was one week ago. Reports intermittent headache and dizziness. She states she does not feel like doing anything, which she attributes to fatigue. Denies chest pain, shortness of breath, vomiting, diarrhea, lower extremity edema, numbness, paresthesia, bowel or bladder incontinence.    History reviewed. No pertinent past medical history. History reviewed. No pertinent past surgical history. No family history on file. Social History  Substance Use Topics  . Smoking status: Never Smoker   . Smokeless tobacco: None  . Alcohol Use: No   OB History    Gravida Para Term Preterm AB TAB SAB Ectopic Multiple Living   1               Review of Systems  Constitutional: Positive for fever, activity change and fatigue. Negative for chills and appetite change.  HENT: Negative for congestion.   Respiratory: Negative for cough and shortness of breath.   Cardiovascular: Negative for chest pain, palpitations and leg swelling.  Gastrointestinal: Positive for nausea, abdominal pain, constipation and abdominal distention. Negative for vomiting, diarrhea and blood in stool.  Genitourinary: Positive for dysuria and vaginal discharge. Negative for urgency, frequency and vaginal bleeding.  Musculoskeletal: Positive for myalgias and back pain. Negative for joint swelling, arthralgias, gait problem, neck pain and neck  stiffness.  Skin: Negative for color change, pallor, rash and wound.  Neurological: Positive for weakness. Negative for dizziness, light-headedness, numbness and headaches.  All other systems reviewed and are negative.     Allergies  Penicillins  Home Medications   Prior to Admission medications   Medication Sig Start Date End Date Taking? Authorizing Provider  IBUPROFEN PO Take 500 mg by mouth daily as needed (pain).    Historical Provider, MD  omeprazole (PRILOSEC) 40 MG capsule Take 1 capsule (40 mg total) by mouth daily. 03/18/15   Charm Rings, MD  ondansetron (ZOFRAN) 4 MG tablet Take 1 tablet (4 mg total) by mouth every 8 (eight) hours as needed for nausea or vomiting. 03/18/15   Charm Rings, MD    BP 113/62 mmHg  Pulse 67  Temp(Src) 98.4 F (36.9 C) (Oral)  Resp 16  Ht 5\' 1"  (1.549 m)  Wt 168 lb 12.8 oz (76.567 kg)  BMI 31.91 kg/m2  SpO2 100%  LMP 01/29/2015 (Exact Date) Physical Exam  Constitutional: She is oriented to person, place, and time. She appears well-developed and well-nourished. No distress.  Pleasant female sitting in bed, appears uncomfortable.  HENT:  Head: Normocephalic and atraumatic.  Right Ear: External ear normal.  Left Ear: External ear normal.  Nose: Nose normal.  Mouth/Throat: Uvula is midline, oropharynx is clear and moist and mucous membranes are normal.  Eyes: Conjunctivae, EOM and lids are normal. Pupils are equal, round, and reactive to light. Right eye exhibits no discharge. Left eye exhibits no discharge. No scleral icterus.  Neck: Normal range of motion. Neck supple.  Cardiovascular: Normal rate, regular rhythm, normal heart sounds, intact distal pulses and normal pulses.   Pulmonary/Chest: Effort normal and breath sounds normal. No respiratory distress. She has no wheezes. She has no rales. She exhibits no tenderness.  Abdominal: Soft. Normal appearance and bowel sounds are normal. She exhibits no distension and no mass. There is  tenderness in the suprapubic area. There is no rigidity, no rebound and no guarding.  Mild TTP of suprapubic region. No rebound or guarding.  Genitourinary: Rectal exam shows no mass, no tenderness and anal tone normal. Uterus is enlarged. Cervix exhibits discharge. Cervix exhibits no motion tenderness. Right adnexum displays no mass and no tenderness. Left adnexum displays no mass and no tenderness. No erythema, tenderness or bleeding in the vagina. No foreign body around the vagina. No signs of injury around the vagina. Vaginal discharge found.  Small amount of yellow discharge in vaginal vault. Os closed. No CMT. No TTP of uterus or adnexa. No palpable fecal impaction on rectal exam.   Musculoskeletal: Normal range of motion. She exhibits tenderness. She exhibits no edema.  Diffuse TTP of thoracic and lumbar paraspinal muscles. No midline tenderness, step-off, or deformity.  Neurological: She is alert and oriented to person, place, and time. She has normal strength. No sensory deficit.  Skin: Skin is warm, dry and intact. No rash noted. She is not diaphoretic. No erythema. No pallor.  Psychiatric: She has a normal mood and affect. Her speech is normal and behavior is normal. Judgment and thought content normal.  Nursing note and vitals reviewed.   ED Course  Procedures (including critical care time)  Labs Review Labs Reviewed  WET PREP, GENITAL - Abnormal; Notable for the following:    Clue Cells Wet Prep HPF POC FEW (*)    WBC, Wet Prep HPF POC TOO NUMEROUS TO COUNT (*)    All other components within normal limits  COMPREHENSIVE METABOLIC PANEL - Abnormal; Notable for the following:    Sodium 134 (*)    Potassium 3.4 (*)    Albumin 3.4 (*)    ALT 13 (*)    Total Bilirubin 0.2 (*)    All other components within normal limits  URINALYSIS, ROUTINE W REFLEX MICROSCOPIC (NOT AT [facility]) - Abnormal; Notable for the following:    Hgb urine dipstick TRACE (*)    All other components within  normal limits  HCG, QUANTITATIVE, PREGNANCY - Abnormal; Notable for the following:    hCG, Beta Chain, Quant, S [id number] (*)    All other components within normal limits  URINE MICROSCOPIC-ADD ON - Abnormal; Notable for the following:    Squamous Epithelial / LPF FEW (*)    Bacteria, UA FEW (*)    All other components within normal limits  LIPASE, BLOOD  CBC  GC/CHLAMYDIA PROBE AMP ([place]) NOT AT [facility]    Imaging Review No results found.   I have personally reviewed and evaluated these lab results as part of my medical decision-making.   EKG Interpretation None      MDM   Final diagnoses:  Intrauterine pregnancy  Abdominal pain, unspecified abdominal location   34 year old female presents with back pain, abdominal bloating, subjective fever, nausea, dysuria x 1 month and white vaginal discharge x 6 days. States she is [redacted] weeks pregnant.  Reports last BM was one week ago. Reports intermittent headache and dizziness, fatigue. Denies chest pain, shortness of breath, vomiting, diarrhea, lower extremity edema,  numbness, paresthesia, bowel or bladder incontinence.  Patient is afebrile. Vital signs stable. Mild TTP of suprapubic region of abdomen. No rebound, guarding, or masses. Small amount of yellow discharge in vaginal vault. No CMT. Os closed. No tenderness to palpation of uterus or adnexa. No fecal impaction on rectal exam. Mild TTP of thoracic and cervical paraspinal muscles. No midline tenderness, step-off, or deformity.  UA negative for infection. HCG 145,286. CBC negative for leukocytosis. CMP, lipase unremarkable. Wet prep demonstrates few clue cells and TNTC WBC.   Do not feel treatment for BV is indicated at this time given no amine odor, no copious discharge coating vaginal vault. Patient complains of small amount of white vaginal discharge, likely consistent with physiologic vaginal discharge during pregnancy.   Pain treated with tylenol in the ED. Abdominal US  performed by Dr. Fayrene Fearing, which demonstrates single intrauterine pregnancy, fetal HR 142. Discussed with patient that symptoms are most likely related to pregnancy. Will treat constipation with laxative and pain with tylenol. Patient to follow-up with OB/GYN. Return precautions discussed.  BP 95/52 mmHg  Pulse 60  Temp(Src) 98.4 F (36.9 C) (Oral)  Resp 14  Ht 5\' 1"  (1.549 m)  Wt 168 lb 12.8 oz (76.567 kg)  BMI 31.91 kg/m2  SpO2 99%  LMP 01/29/2015 (Exact Date)    Mady Gemma, PA-C 04/02/15 0120  Rolland Porter, MD 04/09/15 [phone]

## 2015-04-01 NOTE — ED Notes (Addendum)
Pt reports to the ED for eval of constipation, fevers, nausea, and abd bloating. She is also having some right flank/low back pain. She reports she is also having some dysuria, frequency, and urgency. Pt denies any numbness, tingling, paralysis, or bowel or bladder incontience. Last BM 7 days ago. Pt A&Ox4, resp e/u, and skin warm and dry. Pt is also pregnant; approx 8 weeks

## 2015-04-01 NOTE — Discharge Instructions (Signed)
1. Medications: milk of magnesia, tylenol, usual home medications 2. Treatment: rest, drink plenty of fluids 3. Follow Up: please followup with your primary doctor this week for discussion of your diagnoses and further evaluation after today's visit; if you do not have a primary care doctor use the resource guide provided to find one; please return to the ER for severe pain, vaginal bleeding, chest pain, shortness of breath, new or worsening symptoms   Dolor abdominal durante el embarazo (Abdominal Pain During Pregnancy) El dolor de vientre (abdominal) es habitual durante el embarazo. Generalmente no se trata de un problema grave. Otras veces puede ser un signo de que algo no anda bien. Siempre comunquese con su mdico si tiene dolor abdominal. CUIDADOS EN EL HOGAR Controle el dolor para ver si hay cambios. Las indicaciones que siguen pueden ayudarla a sentirse mejor:  Hospital doctor (relaciones sexuales) ni se coloque nada dentro de la vagina hasta que se sienta mejor.  Haga reposo hasta que el dolor se calme.  Si siente ganas de vomitar (nuseas ) beba lquidos claros. No consuma alimentos slidos hasta que se sienta mejor.  Slo tome los medicamentos que le haya indicado su mdico.  Cumpla con las visitas al mdico segn las indicaciones. SOLICITE AYUDA DE INMEDIATO SI:   Tiene un sangrado, pierde lquido o elimina trozos de tejido por la vagina.  Siente ms dolor o clicos.  Comienza a vomitar.  Siente dolor al orinar u observa sangre en la orina.  Tiene fiebre.  No siente que el beb se mueva mucho.  Se siente muy dbil o cree que va a desmayarse.  Tiene dificultad para respirar con o sin dolor en el vientre.  Siente un dolor de cabeza muy intenso y Engineer, mining en el vientre.  Observa que sale un lquido por la vagina y tiene dolor abdominal.  La materia fecal es lquida (diarrea).  El dolor en el viente no desaparece, o empeora, luego de hacer reposo. ASEGRESE DE QUE:    Comprende estas instrucciones.  Controlar su afeccin.  Recibir ayuda de inmediato si no mejora o si empeora. Document Released: 03/25/2011 Document Revised: 03/15/2013 [facility] Patient Information 2015 Time, [place]. This information is not intended to replace advice given to you by your health care provider. Make sure you discuss any questions you have with your health care provider.   Emergency Department Resource Guide 1) Find a Doctor and Pay Out of Pocket Although you won't have to find out who is covered by your insurance plan, it is a good idea to ask around and get recommendations. You will then need to call the office and see if the doctor you have chosen will accept you as a new patient and what types of options they offer for patients who are self-pay. Some doctors offer discounts or will set up payment plans for their patients who do not have insurance, but you will need to ask so you aren't surprised when you get to your appointment.  2) Contact Your Local Health Department Not all health departments have doctors that can see patients for sick visits, but many do, so it is worth a call to see if yours does. If you don't know where your local health department is, you can check in your phone book. The CDC also has a tool to help you locate your state's health department, and many state websites also have listings of all of their local health departments.  3) Find a Walk-in Clinic If your illness is not  likely to be very severe or complicated, you may want to try a walk in clinic. These are popping up all over the country in pharmacies, drugstores, and shopping centers. They're usually staffed by nurse practitioners or physician assistants that have been trained to treat common illnesses and complaints. They're usually fairly quick and inexpensive. However, if you have serious medical issues or chronic medical problems, these are probably not your best option.  No Primary Care  Doctor: - Call Health Connect at  [phone] - they can help you locate a primary care doctor that  accepts your insurance, provides certain services, etc. - Physician Referral Service- [phone]  Chronic Pain Problems: Organization         Address  Phone   Notes  [facility]  [phone] Patients need to be referred by their primary care doctor.   Medication Assistance: Organization         Address  Phone   Notes  [facility] Medication [facility] [address] [place], [place] [address] [phone] --Must be a resident of [facility] -- Must have NO insurance coverage whatsoever (no Medicaid/ Medicare, etc.) -- The pt. MUST have a primary care doctor that directs their care regularly and follows them in the community   MedAssist  [phone]   [employer]  [phone]    Agencies that provide inexpensive medical care: Organization         Address  Phone   Notes  [place]  [phone]   [facility]    [phone]   [facility] [place], [place] [address] [phone]   [place] [address] [place] [phone]   Planned Parenthood    [phone]   [facility]    [phone]   [place] and [place] [address], [place] Phone:  [phone], Fax:  [phone] Hours of Operation:  9 am - 6 pm, M-F.  Also accepts Medicaid/Medicare and self-pay.  [facility] for [place], [address], [place] Phone: [phone], Fax: [phone]. Hours of Operation:  8:30 am - 5:30 pm, M-F.  Also accepts Medicaid and self-pay.  [facility] [place] [address], [place] Phone: [phone]   [place], [place], [place] [phone], Ext. [phone] Mondays & Thursdays: 7-9 AM.  First 15 patients are seen on a first  come, first serve basis.    [place] Providers:  Organization         Address  Phone   Notes  [facility] [address],  [phone] Also accepts self-pay patients.  [facility] [id number] [place], [place]  [phone]   [place], [address], [place] [phone]   [facility] [address] [place] [phone]   Lucianne Lei [address], [place]   [phone] Only accepts [place] Access [place] patients after they have their name applied to their card.   Self-Pay (no insurance) in [facility]:  Organization         Address  Phone   Notes  Sickle Cell Patients, [facility] [place] [phone]   [facility]arias Pontes  [facility] Urgent Care [place] [phone]   [facility] [place]  [place], [place], [place] [phone]   [facility]/Dr. Osei-Bonsu  [address], [place] or [address], [place] [phone] Phone number for both [place] and [place] locations is the same.  Urgent Medical and [facility] [address] [place] [phone]   [facility] [address] [place] or [address] [phone]   [facility] [address] [place] [phone], phone; [phone], fax Sees patients 1st and 3rd Saturday of every month.  Must not qualify for public or private insurance (i.e. Medicaid, Medicare, [place] Health Choice, Veterans' Benefits)  Household income should be no more than 200% of the poverty level The clinic cannot treat you if you are pregnant or think you are pregnant  Sexually transmitted diseases are not treated at the clinic.    Dental Care: Organization          Address  Phone  Notes  [facility] Department of [facility] [phone] Accepts children up to age 102 who are enrolled in [place] or [place]; pregnant women with a Medicaid card; and children who have applied for Medicaid or [place] Health Choice, but were declined, whose parents can pay a reduced fee at time of service.  [facility] Department of [facility]  [address], [place] [phone] Accepts children up to age 35 who are enrolled in [place] or [place]; pregnant women with a Medicaid card; and children who have applied for Medicaid or  Health Choice, but were declined, whose parents can pay a reduced fee at time of service.  [place] Adult Dental Access PROGRAM  [place] [phone] Patients are seen by appointment only. Walk-ins are not accepted. [place] will see patients 82 years of age and older. Monday - Tuesday (8am-5pm) Most Wednesdays (8:30-5pm) $30 per visit, cash only  [facility] Adult Dental Access PROGRAM  [address], [facility] [phone] Patients are seen by appointment only. Walk-ins are not accepted. [place] will see patients 71 years of age and older. One Wednesday Evening (Monthly: Volunteer Based).  $30 per visit, cash only  [place]  [phone] for adults; Children under age 64, call Graduate Pediatric Dentistry at [phone]. Children aged 11-14, please call [phone] to request a pediatric application.  Dental services are provided in all areas of dental care including fillings, crowns and bridges, complete and partial dentures, implants, gum treatment, root canals, and extractions. Preventive care is also provided. Treatment is provided to both adults and children. Patients are selected via a lottery and there is often a waiting list.   [facility] [address] [place]  [phone] [email or web address]   [facility] [address] [place], [place] [phone], Ext. [phone] Second and Fourth Thursday of each month, opens at 6:30 AM; Clinic ends at 9 AM.  Patients are seen on a first-come first-served basis, and a limited number are seen during each clinic.   [facility]  [address] Hillard Danker [place], [place] [phone]   Eligibility Requirements You must have lived in [place], [place], or [place] counties for at least the last three  months.   You cannot be eligible for state or federal sponsored [employer], including [employer], [place], or [employer].   You generally cannot be eligible for healthcare insurance through your employer.    How to apply: Eligibility screenings are held every Tuesday and Wednesday afternoon from 1:00 pm until 4:00 pm. You do not need an appointment for the interview!  [facility] [address], [place], [place]   [place]  [place] Department  [place]  [phone]    Behavioral Health Resources in the Community: Intensive Outpatient Programs Organization         Address  Phone  Notes  Joffre McCool. [address], [place], [place] [phone]   [facility] [address] [place], [place]   ADS: Alcohol & Drug Svcs [address], [place], [place]   [place] [address],  [place], [place] or [phone]   Substance Abuse Resources Organization         Address  Phone  Notes  Alcohol and Drug Services  [phone]   [place]  [phone]   [facility] [place]   Chinita Pester  [phone]   Residential & Outpatient Substance Abuse Program  [phone]   Psychological  Services Organization         Address  Phone  Notes  [facility] [place]  [place]  [phone]   [place] [address] [place] or [phone]    Mobile Crisis Teams Organization         Address  Phone  Notes  [facility], Mobile Crisis Care Unit  [phone]   Assertive Psychotherapeutic Services  [address] [place], [place]   [place] Levels [address] [place] [phone]    Self-Help/Support Groups Organization         Address  Phone             Notes  [place]. of [place] - variety of support groups  [place] Call for more information  Narcotics Anonymous (NA), [facility] [address], [employer] [place]  2 meetings at this location   Special educational needs teacher         Address  Phone  Notes  ASAP Residential Treatment [place],    [place]  [phone]   [facility]  [address], [place] [id number], [place], [place]   [place], [place] [phone] Admissions: 8am-3pm M-F  Incentives Substance [place] [address],    [place], [place] [phone]   [facility] [address] [place], [place], [place]   [facility] [address]  [place], [place]   [facility] - Intensive Outpatient [address], [place], [place]   [facility] [place].,  [place], [place] [phone] or [phone]   [facility] Services (RTS) [address], [place], [place] Accepts Medicaid  [facility] [place] [address],  [place] [phone] Substance Abuse/Addiction Treatment   [facility] Organization         Address  Phone  Notes  [facility]  [phone]   Domenic Schwab,  PhD [address] [place]lis [place], [place]   [phone] or (336) [place], [place] [phone]   [place] [address], [place], [place] [phone] Insurance/Medicaid/sponsorship through [facility] and Families [address], [address] [place]                                    [place], [place] [phone] [place] [address], [place] [phone]    Dr. Adele Schilder  [phone]   [facility] [place] Dept. 1) [address] [place] 2) [place] 3)  [place] [address], [place] [phone]  [phone]   [place] [phone] or [phone] (After Hours)

## 2015-04-02 LAB — GC/CHLAMYDIA PROBE AMP (~~LOC~~) NOT AT ARMC
Chlamydia: NEGATIVE
NEISSERIA GONORRHEA: NEGATIVE

## 2015-04-12 ENCOUNTER — Other Ambulatory Visit: Payer: Self-pay

## 2015-04-12 DIAGNOSIS — Z3491 Encounter for supervision of normal pregnancy, unspecified, first trimester: Secondary | ICD-10-CM

## 2015-04-12 NOTE — Progress Notes (Signed)
NEW OB LABS DONE TODAY MARCI HOLDER 

## 2015-04-13 LAB — SICKLE CELL SCREEN: SICKLE CELL SCREEN: NEGATIVE

## 2015-04-13 LAB — HIV ANTIBODY (ROUTINE TESTING W REFLEX): HIV 1&2 Ab, 4th Generation: NONREACTIVE

## 2015-04-14 LAB — CULTURE, OB URINE

## 2015-04-15 LAB — OBSTETRIC PANEL
ANTIBODY SCREEN: NEGATIVE
BASOS PCT: 0 % (ref 0–1)
Basophils Absolute: 0 10*3/uL (ref 0.0–0.1)
EOS ABS: 0.1 10*3/uL (ref 0.0–0.7)
Eosinophils Relative: 1 % (ref 0–5)
HEMATOCRIT: 36.2 % (ref 36.0–46.0)
HEMOGLOBIN: 12 g/dL (ref 12.0–15.0)
Hepatitis B Surface Ag: NEGATIVE
LYMPHS ABS: 2 10*3/uL (ref 0.7–4.0)
Lymphocytes Relative: 24 % (ref 12–46)
MCH: 28.6 pg (ref 26.0–34.0)
MCHC: 33.1 g/dL (ref 30.0–36.0)
MCV: 86.4 fL (ref 78.0–100.0)
MONO ABS: 0.6 10*3/uL (ref 0.1–1.0)
MPV: 12.3 fL (ref 8.6–12.4)
Monocytes Relative: 7 % (ref 3–12)
NEUTROS PCT: 68 % (ref 43–77)
Neutro Abs: 5.6 10*3/uL (ref 1.7–7.7)
Platelets: 229 10*3/uL (ref 150–400)
RBC: 4.19 MIL/uL (ref 3.87–5.11)
RDW: 13.3 % (ref 11.5–15.5)
RH TYPE: POSITIVE
WBC: 8.3 10*3/uL (ref 4.0–10.5)

## 2015-04-19 ENCOUNTER — Other Ambulatory Visit (HOSPITAL_COMMUNITY)
Admission: RE | Admit: 2015-04-19 | Discharge: 2015-04-19 | Disposition: A | Discharge status: Home / Self Care | Payer: Self-pay | Source: Ambulatory Visit | Attending: Family Medicine | Admitting: Family Medicine

## 2015-04-19 ENCOUNTER — Ambulatory Visit (INDEPENDENT_AMBULATORY_CARE_PROVIDER_SITE_OTHER): Payer: Self-pay | Admitting: Family Medicine

## 2015-04-19 ENCOUNTER — Encounter: Payer: Self-pay | Admitting: Family Medicine

## 2015-04-19 VITALS — BP 99/68 | HR 69 | Temp 98.4°F | Wt 165.8 lb

## 2015-04-19 DIAGNOSIS — Z113 Encounter for screening for infections with a predominantly sexual mode of transmission: Secondary | ICD-10-CM | POA: Insufficient documentation

## 2015-04-19 DIAGNOSIS — O2311 Infections of bladder in pregnancy, first trimester: Secondary | ICD-10-CM

## 2015-04-19 DIAGNOSIS — Z331 Pregnant state, incidental: Secondary | ICD-10-CM

## 2015-04-19 DIAGNOSIS — Z3490 Encounter for supervision of normal pregnancy, unspecified, unspecified trimester: Secondary | ICD-10-CM

## 2015-04-19 DIAGNOSIS — Z3480 Encounter for supervision of other normal pregnancy, unspecified trimester: Secondary | ICD-10-CM | POA: Insufficient documentation

## 2015-04-19 DIAGNOSIS — Z3481 Encounter for supervision of other normal pregnancy, first trimester: Secondary | ICD-10-CM

## 2015-04-19 MED ORDER — CEPHALEXIN 500 MG PO CAPS: 500.0000 mg | ORAL_CAPSULE | Freq: Two times a day (BID) | ORAL | Status: DC

## 2015-04-19 MED ORDER — DOCUSATE SODIUM 100 MG PO CAPS: 100.0000 mg | ORAL_CAPSULE | Freq: Two times a day (BID) | ORAL | Status: DC

## 2015-04-19 NOTE — Patient Instructions (Signed)
Primer trimestre de embarazo (First Trimester of Pregnancy) El primer trimestre de embarazo se extiende desde la semana1 hasta el final de la semana12 (mes1 al mes3). Durante este tiempo, el beb comenzar a desarrollarse dentro suyo. Entre la semana6 y la8, se forman los ojos y el rostro, y los latidos del corazn pueden verse en la ecografa. Al final de las 12semanas, todos los rganos del beb estn formados. La atencin prenatal es toda la asistencia mdica que usted recibe antes del nacimiento del beb. Asegrese de recibir una buena atencin prenatal y de seguir todas las indicaciones del mdico. CUIDADOS EN EL HOGAR  Medicamentos:  Tome los medicamentos solamente como se lo haya indicado el mdico. Algunos medicamentos se pueden tomar durante el embarazo y otros no.  Tome las vitaminas prenatales como se lo haya indicado el mdico.  Tome el medicamento que la ayuda a defecar (laxante suave) segn sea necesario, si el mdico lo autoriza. Dieta  Ingiera alimentos saludables de manera regular.  El mdico le indicar la cantidad de peso que puede aumentar.  No coma carne cruda ni quesos sin cocinar.  Si tiene malestar estomacal (nuseas) o vomita:  Ingiera 4 o 5comidas pequeas por da en lugar de 3abundantes.  Intente comer algunas galletitas saladas.  Beba lquidos entre las comidas, en lugar de hacerlo durante estas.  Si tiene dificultad para defecar (estreimiento):  Consuma alimentos con alto contenido de fibra, como verduras y frutas frescos, y cereales integrales.  Beba suficiente lquido para mantener el pis (orina) claro o de color amarillo plido. Actividad y ejercicios  Haga ejercicios solamente como se lo haya indicado el mdico. Deje de hacer ejercicios si tiene clicos o dolor en la parte baja del vientre (abdomen) o en la cintura.  Intente no estar de pie durante mucho tiempo. Mueva las piernas con frecuencia si debe estar de pie en un lugar durante  mucho tiempo.  Evite levantar pesos excesivos.  Use zapatos con tacones bajos. Mantenga una buena postura al sentarse y pararse.  Puede tener relaciones sexuales, a menos que el mdico le indique lo contrario. Alivio del dolor o las molestias  Use un sostn que le brinde buen soporte si le duelen las mamas.  Dese baos con agua tibia (baos de asiento) para aliviar el dolor o las molestias a causa de las hemorroides. Use crema antihemorroidal si el mdico se lo permite.  Descanse con las piernas elevadas si tiene calambres o dolor de cintura.  Use medias de descanso si tiene las venas de las piernas hinchadas y abultadas (venas varicosas). Eleve los pies durante 15minutos, 3 o 4veces por da. Limite la cantidad de sal en su dieta. Cuidados prenatales  Programe las visitas prenatales para la semana12 de embarazo.  Escriba sus preguntas. Llvelas cuando concurra a las visitas prenatales.  Concurra a todas las visitas prenatales como se lo haya indicado el mdico. Seguridad  Colquese el cinturn de seguridad cuando conduzca.  Haga una lista con los nmeros de telfono en caso de emergencia, en la cual deben incluirse los nmeros de los familiares, los amigos, el hospital y los departamentos de polica y de bomberos. Consejos generales  Pdale al mdico que la derive a clases prenatales en su localidad. Debe comenzar a tomar las clases antes de entrar en el mes6 de embarazo.  Pida ayuda si necesita asesoramiento o asistencia con la alimentacin. El mdico puede aconsejarla o indicarle dnde recurrir para recibir ayuda.  No se d baos de inmersin en agua caliente,   baos turcos ni saunas.  No se haga duchas vaginales ni use tampones o toallas higinicas perfumadas.  No mantenga las piernas cruzadas durante mucho tiempo.  Evite el contacto con las bandejas sanitarias de los gatos y la tierra que estos animales usan.  No fume, no consuma hierbas ni beba alcohol. No tome  frmacos que el mdico no haya autorizado.  Visite al dentista. En su casa, lvese los dientes con un cepillo dental suave. Psese el hilo dental con suavidad. SOLICITE AYUDA SI:  Tiene mareos.  Tiene clicos leves o siente presin en la parte baja del vientre.  Siente un dolor persistente en la zona del vientre.  Sigue teniendo malestar estomacal, vomita o las heces son lquidas (diarrea).  Observa una secrecin, con mal olor que proviene de la vagina.  Siente dolor al orinar.  Tiene el rostro, las manos, las piernas o los tobillos ms hinchados (inflamados). SOLICITE AYUDA DE INMEDIATO SI:   Tiene fiebre.  Tiene una prdida de lquido por la vagina.  Tiene sangrado o pequeas prdidas vaginales.  Tiene clicos o dolor muy intensos en el vientre.  Sube o baja de peso rpidamente.  Vomita sangre. Puede ser similar a la borra del caf  Est en contacto con personas que tienen rubola, la quinta enfermedad o varicela.  Siente un dolor de cabeza muy intenso.  Le falta el aire.  Sufre cualquier tipo de traumatismo, por ejemplo, debido a una cada o un accidente automovilstico. Document Released: 10/09/2008 Document Revised: 11/27/2013 ExitCare Patient Information 2015 [employer]. This information is not intended to replace advice given to you by your health care provider. Make sure you discuss any questions you have with your health care provider.  

## 2015-04-19 NOTE — Progress Notes (Signed)
Karla Reynolds is a 34 y.o. yo G3P2002 at Unknown who presents for her initial prenatal visit. Pregnancy is not planned She reports low back pain and constipation. Has been taking magnesium for constipation. Takes every 2 days. Also endorses some  She  is taking PNV. See flow sheet for details.  PMH, POBH, FH, meds, allergies and Social Hx reviewed.  Prenatal exam:Gen: Well nourished, well developed.  No distress.  Vitals noted. HEENT: Normocephalic, atraumatic.  Neck supple without cervical lymphadenopathy, thyromegaly or thyroid nodules.  fair dentition. CV: RRR no murmur, gallops or rubs Lungs: CTA B.  Normal respiratory effort without wheezes or rales. Abd: soft, NTND. +BS.  Uterus not appreciated above pelvis. GU: Normal external female genitalia without lesions.  Nl vaginal, well rugated without lesions. No vaginal discharge.  Bimanual exam: No adnexal mass or TTP. No CMT.   Ext: No clubbing, cyanosis or edema. Psych: Normal grooming and dress.  Not depressed or anxious appearing.  Normal thought content and process without flight of ideas or looseness of associations    Assessment/plan: 1) Pregnancy [redacted]w[redacted]d doing well.  Current pregnancy issues include constipation and back pain Dating is reliable Bleeding and pain precautions reviewed. Importance of prenatal vitamins reviewed.  Early glucola is not indicated. GC/CT probe sent today. Dating ultrasound ordered Will treat constipation with bid colace  2)Prenatal labs reviewed, notable for e coli UTI. Will treat with 7 day course of keflex. Will repeat UA and urine culture at next OB visit  3) Genetic screening offered. Will refer to MFM for integrated screening  Follow up 4 weeks.

## 2015-04-22 LAB — CERVICOVAGINAL ANCILLARY ONLY
Chlamydia: NEGATIVE
NEISSERIA GONORRHEA: NEGATIVE

## 2015-04-24 ENCOUNTER — Encounter: Payer: Self-pay | Admitting: Family Medicine

## 2015-04-26 ENCOUNTER — Ambulatory Visit (HOSPITAL_COMMUNITY)
Admission: RE | Admit: 2015-04-26 | Discharge: 2015-04-26 | Disposition: A | Discharge status: Home / Self Care | Payer: Self-pay | Source: Ambulatory Visit | Attending: Family Medicine | Admitting: Family Medicine

## 2015-04-26 ENCOUNTER — Encounter: Payer: Self-pay | Admitting: Family Medicine

## 2015-04-26 DIAGNOSIS — O209 Hemorrhage in early pregnancy, unspecified: Secondary | ICD-10-CM | POA: Insufficient documentation

## 2015-04-26 DIAGNOSIS — Z3A12 12 weeks gestation of pregnancy: Secondary | ICD-10-CM | POA: Insufficient documentation

## 2015-04-26 DIAGNOSIS — Z3490 Encounter for supervision of normal pregnancy, unspecified, unspecified trimester: Secondary | ICD-10-CM

## 2015-05-17 ENCOUNTER — Ambulatory Visit (INDEPENDENT_AMBULATORY_CARE_PROVIDER_SITE_OTHER): Payer: Self-pay | Admitting: Family Medicine

## 2015-05-17 ENCOUNTER — Encounter: Payer: Self-pay | Admitting: Family Medicine

## 2015-05-17 VITALS — BP 128/75 | HR 83 | Temp 97.5°F | Wt 164.0 lb

## 2015-05-17 DIAGNOSIS — O0001 Abdominal pregnancy with intrauterine pregnancy: Secondary | ICD-10-CM

## 2015-05-17 MED ORDER — FOSFOMYCIN TROMETHAMINE 3 G PO PACK: 3.0000 g | PACK | Freq: Once | ORAL | Status: DC

## 2015-05-17 NOTE — Progress Notes (Signed)
Karla Reynolds is a 34 y.o. G15P1 at 3043w3d for routine follow up.  She reports no complaints See flow sheet for details.  Could only take one dose of antibotic. Got nausea, short of breath, and tremors. Continues to have dysuria. No fevers.   Physical Vitals noted, see flowsheet. NAD, No CVA tenderness, uterus below umbilicus. Abdomen S, NT, ND.   A/P: Pregnancy at 1243w3d.  Doing well.   Pregnancy issues include UTI Patient intolerant of keflex - will treat with one time dose of fosfomycin Follow up UA at return visit Anatomy ultrasound ordered to be scheduled at 18-19 weeks.  Pt  is interested in genetic screening. Will obtain quad screen next week.  Bleeding and pain precautions reviewed. Follow up 4 weeks.

## 2015-05-17 NOTE — Patient Instructions (Signed)
Segundo trimestre de Media planner (Second Trimester of Pregnancy) El segundo trimestre va desde la semana13 hasta la 66, desde el cuarto hasta el sexto mes, y suele ser el momento en el que mejor se siente. En general, las nuseas matutinas han disminuido o han desaparecido completamente. Tendr ms energa y podr aumentarle el apetito. El beb por nacer (feto) se desarrolla rpidamente. Hacia el final del sexto mes, el beb mide aproximadamente 9 pulgadas (23 cm) y pesa alrededor de 1 libras (700 g). Es probable que sienta al beb moverse (dar pataditas) entre las 18 y 4 semanas del Media planner. CUIDADOS EN EL HOGAR   No fume, no consuma hierbas ni beba alcohol. No tome frmacos que el mdico no haya autorizado.  No consuma ningn producto que contenga tabaco, lo que incluye cigarrillos, tabaco de Higher education careers adviser o Psychologist, sport and exercise. Si necesita ayuda para dejar de fumar, consulte al [employer]. Puede recibir asesoramiento u otro tipo de apoyo para dejar de fumar.  Tome los medicamentos solamente como se lo haya indicado el mdico. Algunos medicamentos son seguros para tomar durante el Media planner y otros no lo son.  Haga ejercicios solamente como se lo haya indicado el mdico. Interrumpa la actividad fsica si comienza a tener calambres.  Ingiera alimentos saludables de [place] regular.  Use un sostn que le brinde buen soporte si sus mamas estn sensibles.  No se d baos de inmersin en agua caliente, baos turcos ni saunas.  Colquese el cinturn de seguridad cuando conduzca.  No coma carne cruda ni queso sin cocinar; evite el contacto con las bandejas sanitarias de los gatos y la tierra que estos animales usan.  White.  Tome entre 1500 y 2000mg  de calcio diariamente comenzando en la [id number] del embarazo [place].  Pruebe tomar un medicamento que la ayude a defecar (un laxante suave) si el mdico lo autoriza. Consuma ms fibra, que se encuentra en las frutas y  verduras frescas y los cereales integrales. Beba suficiente lquido para mantener el pis (orina) claro o de color amarillo plido.  Dese baos de asiento con agua tibia para Best boy o las molestias causadas por las hemorroides. Use una crema para las hemorroides si el mdico la autoriza.  Si se le hinchan las venas (venas varicosas), use medias de descanso. Levante (eleve) los pies durante 73minutos, 3 o 4veces por Training and development officer. Limite el consumo de sal en su dieta.  No levante objetos pesados, use zapatos de tacones bajos y sintese derecha.  Descanse con las piernas elevadas si tiene calambres o dolor de cintura.  Visite a su dentista si no lo ha [place]ar[place] manager. Use un cepillo de cerdas suaves para cepillarse los dientes. Psese el hilo dental con suavidad.  Puede seguir [employer], a menos que el mdico le indique lo contrario.  Concurra a los controles mdicos. SOLICITE AYUDA SI:   Siente mareos.  Sufre calambres o presin leves en la parte baja del vientre (abdomen).  Sufre un dolor persistente en el abdomen.  Tiene Higher education careers adviser (nuseas), vmitos, o tiene deposiciones acuosas (diarrea).  Advierte un olor ftido que proviene de la vagina.  Siente dolor al [employer]. SOLICITE AYUDA DE INMEDIATO SI:   Tiene fiebre.  Tiene una prdida de lquido por la vagina.  Tiene sangrado o pequeas prdidas vaginales.  Siente dolor intenso o clicos en el abdomen.  Sube o baja de peso rpidamente.  Tiene dificultades para recuperar el aliento y siente dolor en el pecho.  Sbitamente  se le hinchan mucho el rostro, las manos, los tobillos, los pies o las piernas.  No ha sentido los movimientos del beb durante una hora.  Siente un dolor de cabeza intenso que no se alivia con medicamentos.  Su visin se modifica.   Esta informacin no tiene como fin reemplazar el consejo del mdico. Asegrese de hacerle al mdico cualquier pregunta que  tenga.   Document Released: 03/15/2013 Document Revised: 08/03/2014 Elsevier Interactive Patient Education 2016 Elsevier Inc.  

## 2015-05-30 ENCOUNTER — Other Ambulatory Visit: Payer: Self-pay

## 2015-05-30 DIAGNOSIS — O0001 Abdominal pregnancy with intrauterine pregnancy: Secondary | ICD-10-CM

## 2015-05-31 ENCOUNTER — Other Ambulatory Visit: Payer: Self-pay

## 2015-06-05 ENCOUNTER — Ambulatory Visit (HOSPITAL_COMMUNITY)
Admission: RE | Admit: 2015-06-05 | Discharge: 2015-06-05 | Disposition: A | Discharge status: Home / Self Care | Payer: Self-pay | Source: Ambulatory Visit | Attending: Family Medicine | Admitting: Family Medicine

## 2015-06-05 DIAGNOSIS — Z36 Encounter for antenatal screening of mother: Secondary | ICD-10-CM | POA: Insufficient documentation

## 2015-06-05 DIAGNOSIS — O0001 Abdominal pregnancy with intrauterine pregnancy: Secondary | ICD-10-CM

## 2015-06-05 DIAGNOSIS — Z3A18 18 weeks gestation of pregnancy: Secondary | ICD-10-CM | POA: Insufficient documentation

## 2015-06-17 ENCOUNTER — Ambulatory Visit (INDEPENDENT_AMBULATORY_CARE_PROVIDER_SITE_OTHER): Payer: Self-pay | Admitting: Family Medicine

## 2015-06-17 ENCOUNTER — Encounter: Payer: Self-pay | Admitting: Family Medicine

## 2015-06-17 VITALS — BP 114/67 | HR 88 | Temp 98.0°F | Wt 167.0 lb

## 2015-06-17 DIAGNOSIS — Z3481 Encounter for supervision of other normal pregnancy, first trimester: Secondary | ICD-10-CM

## 2015-06-17 DIAGNOSIS — O2311 Infections of bladder in pregnancy, first trimester: Secondary | ICD-10-CM

## 2015-06-17 LAB — POCT URINALYSIS DIPSTICK
BILIRUBIN UA: NEGATIVE
GLUCOSE UA: NEGATIVE
KETONES UA: NEGATIVE
Nitrite, UA: NEGATIVE
Protein, UA: NEGATIVE
RBC UA: NEGATIVE
SPEC GRAV UA: 1.02
Urobilinogen, UA: 0.2
pH, UA: 7

## 2015-06-17 LAB — POCT UA - MICROSCOPIC ONLY

## 2015-06-17 NOTE — Patient Instructions (Signed)
Segundo trimestre de Media planner (Second Trimester of Pregnancy) El segundo trimestre va desde la semana13 hasta la 66, desde el cuarto hasta el sexto mes, y suele ser el momento en el que mejor se siente. En general, las nuseas matutinas han disminuido o han desaparecido completamente. Tendr ms energa y podr aumentarle el apetito. El beb por nacer (feto) se desarrolla rpidamente. Hacia el final del sexto mes, el beb mide aproximadamente 9 pulgadas (23 cm) y pesa alrededor de 1 libras (700 g). Es probable que sienta al beb moverse (dar pataditas) entre las 18 y 4 semanas del Media planner. CUIDADOS EN EL HOGAR   No fume, no consuma hierbas ni beba alcohol. No tome frmacos que el mdico no haya autorizado.  No consuma ningn producto que contenga tabaco, lo que incluye cigarrillos, tabaco de Higher education careers adviser o Psychologist, sport and exercise. Si necesita ayuda para dejar de fumar, consulte al [employer]. Puede recibir asesoramiento u otro tipo de apoyo para dejar de fumar.  Tome los medicamentos solamente como se lo haya indicado el mdico. Algunos medicamentos son seguros para tomar durante el Media planner y otros no lo son.  Haga ejercicios solamente como se lo haya indicado el mdico. Interrumpa la actividad fsica si comienza a tener calambres.  Ingiera alimentos saludables de [place] regular.  Use un sostn que le brinde buen soporte si sus mamas estn sensibles.  No se d baos de inmersin en agua caliente, baos turcos ni saunas.  Colquese el cinturn de seguridad cuando conduzca.  No coma carne cruda ni queso sin cocinar; evite el contacto con las bandejas sanitarias de los gatos y la tierra que estos animales usan.  White.  Tome entre 1500 y 2000mg  de calcio diariamente comenzando en la [id number] del embarazo [place].  Pruebe tomar un medicamento que la ayude a defecar (un laxante suave) si el mdico lo autoriza. Consuma ms fibra, que se encuentra en las frutas y  verduras frescas y los cereales integrales. Beba suficiente lquido para mantener el pis (orina) claro o de color amarillo plido.  Dese baos de asiento con agua tibia para Best boy o las molestias causadas por las hemorroides. Use una crema para las hemorroides si el mdico la autoriza.  Si se le hinchan las venas (venas varicosas), use medias de descanso. Levante (eleve) los pies durante 73minutos, 3 o 4veces por Training and development officer. Limite el consumo de sal en su dieta.  No levante objetos pesados, use zapatos de tacones bajos y sintese derecha.  Descanse con las piernas elevadas si tiene calambres o dolor de cintura.  Visite a su dentista si no lo ha [place]ar[place] manager. Use un cepillo de cerdas suaves para cepillarse los dientes. Psese el hilo dental con suavidad.  Puede seguir [employer], a menos que el mdico le indique lo contrario.  Concurra a los controles mdicos. SOLICITE AYUDA SI:   Siente mareos.  Sufre calambres o presin leves en la parte baja del vientre (abdomen).  Sufre un dolor persistente en el abdomen.  Tiene Higher education careers adviser (nuseas), vmitos, o tiene deposiciones acuosas (diarrea).  Advierte un olor ftido que proviene de la vagina.  Siente dolor al [employer]. SOLICITE AYUDA DE INMEDIATO SI:   Tiene fiebre.  Tiene una prdida de lquido por la vagina.  Tiene sangrado o pequeas prdidas vaginales.  Siente dolor intenso o clicos en el abdomen.  Sube o baja de peso rpidamente.  Tiene dificultades para recuperar el aliento y siente dolor en el pecho.  Sbitamente  se le hinchan mucho el rostro, las manos, los tobillos, los pies o las piernas.  No ha sentido los movimientos del beb durante una hora.  Siente un dolor de cabeza intenso que no se alivia con medicamentos.  Su visin se modifica.   Esta informacin no tiene como fin reemplazar el consejo del mdico. Asegrese de hacerle al mdico cualquier pregunta que  tenga.   Document Released: 03/15/2013 Document Revised: 08/03/2014 Elsevier Interactive Patient Education 2016 Elsevier Inc.  

## 2015-06-17 NOTE — Progress Notes (Signed)
Karla Reynolds is a 34 y.o. G1P0 at 8196w4d for routine follow up.  She reports no issues today.  See flow sheet for details.  Patient reports mild intermittent contractions. Described as a cramp-like feeling that lasts 20-25 minutes. Occur very infrequently.   A/P: Pregnancy at 1396w4d.  Doing well.   1) Bacteruria: Repeat UA today with only trace leuks. Will send in for culture. Treat if positive.  2) Incomplete anatomy scan: Will have follow up scan in 4 weeks.  Preterm labor precautions reviewed. Follow up 4 weeks.

## 2015-06-18 LAB — URINE CULTURE

## 2015-06-19 ENCOUNTER — Encounter: Payer: Self-pay | Admitting: Family Medicine

## 2015-07-12 ENCOUNTER — Encounter (HOSPITAL_COMMUNITY): Payer: Self-pay

## 2015-07-12 ENCOUNTER — Other Ambulatory Visit: Payer: Self-pay | Admitting: Family Medicine

## 2015-07-12 ENCOUNTER — Ambulatory Visit (HOSPITAL_COMMUNITY)
Admission: RE | Admit: 2015-07-12 | Discharge: 2015-07-12 | Disposition: A | Discharge status: Home / Self Care | Payer: Self-pay | Source: Ambulatory Visit | Attending: Family Medicine | Admitting: Family Medicine

## 2015-07-12 ENCOUNTER — Ambulatory Visit (INDEPENDENT_AMBULATORY_CARE_PROVIDER_SITE_OTHER): Payer: Self-pay | Admitting: Family Medicine

## 2015-07-12 VITALS — BP 99/53 | HR 85 | Temp 97.6°F | Wt 168.0 lb

## 2015-07-12 DIAGNOSIS — IMO0002 Reserved for concepts with insufficient information to code with codable children: Secondary | ICD-10-CM

## 2015-07-12 DIAGNOSIS — Z36 Encounter for antenatal screening of mother: Secondary | ICD-10-CM | POA: Insufficient documentation

## 2015-07-12 DIAGNOSIS — Z3A23 23 weeks gestation of pregnancy: Secondary | ICD-10-CM | POA: Insufficient documentation

## 2015-07-12 DIAGNOSIS — O283 Abnormal ultrasonic finding on antenatal screening of mother: Secondary | ICD-10-CM | POA: Insufficient documentation

## 2015-07-12 DIAGNOSIS — Z0489 Encounter for examination and observation for other specified reasons: Secondary | ICD-10-CM

## 2015-07-12 DIAGNOSIS — O35EXX0 Maternal care for other (suspected) fetal abnormality and damage, fetal genitourinary anomalies, not applicable or unspecified: Secondary | ICD-10-CM

## 2015-07-12 DIAGNOSIS — O358XX0 Maternal care for other (suspected) fetal abnormality and damage, not applicable or unspecified: Secondary | ICD-10-CM

## 2015-07-12 DIAGNOSIS — O2311 Infections of bladder in pregnancy, first trimester: Secondary | ICD-10-CM

## 2015-07-12 DIAGNOSIS — J020 Streptococcal pharyngitis: Secondary | ICD-10-CM

## 2015-07-12 LAB — POCT RAPID STREP A (OFFICE): Rapid Strep A Screen: NEGATIVE

## 2015-07-12 NOTE — Patient Instructions (Signed)
Your rapid strep test was negative. We will send it for a culture. If you need antibiotics, we will call your.  Please take tylenol instead of ibuprofen for your headaches.  Please schedule an appointment with our OB clinic for 4 weeks for your 28 week visit.  Take care,  Dr Yvone Neu trimestre de embarazo (Second Trimester of Pregnancy) El segundo trimestre va desde la semana13 hasta la 28, desde el cuarto hasta el sexto mes, y suele ser el momento en el que mejor se siente. En general, las nuseas matutinas han disminuido o han desaparecido completamente. Tendr ms energa y podr aumentarle el apetito. El beb por nacer (feto) se desarrolla rpidamente. Hacia el final del sexto mes, el beb mide aproximadamente 9 pulgadas (23 cm) y pesa alrededor de 1 libras (700 g). Es probable que sienta al beb moverse (dar pataditas) entre las 18 y 20 semanas del Psychiatrist. CUIDADOS EN EL HOGAR   No fume, no consuma hierbas ni beba alcohol. No tome frmacos que el mdico no haya autorizado.  No consuma ningn producto que contenga tabaco, lo que incluye cigarrillos, tabaco de Theatre manager o Administrator, Civil Service. Si necesita ayuda para dejar de fumar, consulte al [employer]. Puede recibir asesoramiento u otro tipo de apoyo para dejar de fumar.  Tome los medicamentos solamente como se lo haya indicado el mdico. Algunos medicamentos son seguros para tomar durante el Psychiatrist y otros no lo son.  Haga ejercicios solamente como se lo haya indicado el mdico. Interrumpa la actividad fsica si comienza a tener calambres.  Ingiera alimentos saludables de [place] regular.  Use un sostn que le brinde buen soporte si sus mamas estn sensibles.  No se d baos de inmersin en agua caliente, baos turcos ni saunas.  Colquese el cinturn de seguridad cuando conduzca.  No coma carne cruda ni queso sin cocinar; evite el contacto con las bandejas sanitarias de los gatos y la tierra que estos animales  usan.  Tome las vitaminas prenatales.  Tome entre 1500 y  de calcio diariamente comenzando en la semana20 del embarazo [place].  Pruebe tomar un medicamento que la ayude a defecar (un laxante suave) si el mdico lo autoriza. Consuma ms fibra, que se encuentra en las frutas y verduras frescas y los cereales integrales. Beba suficiente lquido para mantener el pis (orina) claro o de color amarillo plido.  Dese baos de asiento con agua tibia para Engineer, materials o las molestias causadas por las hemorroides. Use una crema para las hemorroides si el mdico la autoriza.  Si se le hinchan las venas (venas varicosas), use medias de descanso. Levante (eleve) los pies durante , 3 o 4veces por Futures trader. Limite el consumo de sal en su dieta.  No levante objetos pesados, use zapatos de tacones bajos y sintese derecha.  Descanse con las piernas elevadas si tiene calambres o dolor de cintura.  Visite a su dentista si no lo ha Occupational hygienist. Use un cepillo de cerdas suaves para cepillarse los dientes. Psese el hilo dental con suavidad.  Puede seguir [employer], a menos que el mdico le indique lo contrario.  Concurra a los controles mdicos. SOLICITE AYUDA SI:   Siente mareos.  Sufre calambres o presin leves en la parte baja del vientre (abdomen).  Sufre un dolor persistente en el abdomen.  Tiene Programme researcher, broadcasting/film/video (nuseas), vmitos, o tiene deposiciones acuosas (diarrea).  Advierte un olor ftido que proviene de la vagina.  Siente dolor al [employer]. SOLICITE  AYUDA DE INMEDIATO SI:   Tiene fiebre.  Tiene una prdida de lquido por la vagina.  Tiene sangrado o pequeas prdidas vaginales.  Siente dolor intenso o clicos en el abdomen.  Sube o baja de peso rpidamente.  Tiene dificultades para recuperar el aliento y siente dolor en el pecho.  Sbitamente se le hinchan mucho el rostro, las [place], los tobillos, los pies o las  piernas.  No ha sentido los movimientos del beb durante Georgianne Fickuna hora.  Siente un dolor de cabeza intenso que no se alivia con medicamentos.  Su visin se modifica.   Esta informacin no tiene Theme park managercomo fin reemplazar el consejo del mdico. Asegrese de hacerle al mdico cualquier pregunta que tenga.   Document Released: 03/15/2013 Document Revised: 08/03/2014 Elsevier Interactive Patient Education [employer] Inc2016 Elsevier Inc.

## 2015-07-12 NOTE — Progress Notes (Signed)
Karla Reynolds is a 34 y.o. G3P2 at 4256w3d for routine follow up.   Subjective:  Has had a low-grade fever and sore throat for the past few days. No cough, sneeze, or nasal congestion. No swollen lymph nodes. No sick contacts. She reports having a history of strep throat in the past. She has been taking ibuprofen for the pain and fever.   Please see flow sheet for OB details  Physical Exam Blood pressure 99/53, pulse 85, temperature 97.6 F (36.4 C), weight 168 lb (76.204 kg), last menstrual period 01/29/2015. GEN: Well appearing in NAD HEENT: O/P mildly erythematous with no exudates. No LAD. MMM CV: RRR, no murmurs Pulm: CTAB, good air-movement MSK: No edema  Rapid Strep negative.   A/P: Pregnancy at 8756w3d.  Doing well.  1) Fever, sore throat - Rapid strep negative, will send culture. Likely viral URI. Instructed patient to use tylenol instead of ibuprofen 2) Follow up anatomy scan reviewed. Now normal with resolution of left hydronephrosis.  3) Preterm labor precautions reviewed. 4) Follow up 4 weeks. Will be seen in OB clinic.

## 2015-07-13 LAB — STREP A DNA PROBE: GASP: NOT DETECTED

## 2015-07-15 ENCOUNTER — Encounter: Payer: Self-pay | Admitting: Family Medicine

## 2015-07-28 NOTE — L&D Delivery Note (Signed)
Delivery Note At 0130 a viable and healthy female was delivered en route to hospital.  Patient reports rupture of membranes shortly before the birth of the baby.  Baby heard actively crying when approaching the car and vigorously moving.  Infant covered with blankets and cord double-clamped and cut.  Patient wheeled to MAU room where placenta was delivered without difficulty.    APGAR: 1 minute unknown; 5 minute 9 ,  Weight pending.   Placenta status:  Intact, spontaneous.      Anesthesia:  None Episiotomy:  None Lacerations:  1st, minimal Suture Repair: None Est. Blood Loss (mL):  350   Mom to postpartum.  Baby to Couplet care / Skin to Skin.  Rochele Reynolds, Karla Bennison N 11/04/2015, 1:56 AM

## 2015-08-15 ENCOUNTER — Ambulatory Visit (INDEPENDENT_AMBULATORY_CARE_PROVIDER_SITE_OTHER): Payer: Self-pay | Admitting: Family Medicine

## 2015-08-15 VITALS — BP 119/70 | HR 80 | Temp 98.1°F | Wt 171.0 lb

## 2015-08-15 DIAGNOSIS — L299 Pruritus, unspecified: Secondary | ICD-10-CM | POA: Insufficient documentation

## 2015-08-15 DIAGNOSIS — Z3402 Encounter for supervision of normal first pregnancy, second trimester: Secondary | ICD-10-CM

## 2015-08-15 DIAGNOSIS — Z3481 Encounter for supervision of other normal pregnancy, first trimester: Secondary | ICD-10-CM

## 2015-08-15 DIAGNOSIS — Z3490 Encounter for supervision of normal pregnancy, unspecified, unspecified trimester: Secondary | ICD-10-CM | POA: Insufficient documentation

## 2015-08-15 LAB — CBC WITH DIFFERENTIAL/PLATELET
BASOS ABS: 0 10*3/uL (ref 0.0–0.1)
BASOS PCT: 0 % (ref 0–1)
EOS PCT: 0 % (ref 0–5)
Eosinophils Absolute: 0 10*3/uL (ref 0.0–0.7)
HEMATOCRIT: 34.7 % — AB (ref 36.0–46.0)
HEMOGLOBIN: 11.7 g/dL — AB (ref 12.0–15.0)
Lymphocytes Relative: 24 % (ref 12–46)
Lymphs Abs: 1.7 10*3/uL (ref 0.7–4.0)
MCH: 28.7 pg (ref 26.0–34.0)
MCHC: 33.7 g/dL (ref 30.0–36.0)
MCV: 85.3 fL (ref 78.0–100.0)
MONO ABS: 0.6 10*3/uL (ref 0.1–1.0)
MONOS PCT: 8 % (ref 3–12)
MPV: 12.4 fL (ref 8.6–12.4)
Neutro Abs: 4.9 10*3/uL (ref 1.7–7.7)
Neutrophils Relative %: 68 % (ref 43–77)
Platelets: 183 10*3/uL (ref 150–400)
RBC: 4.07 MIL/uL (ref 3.87–5.11)
RDW: 13.4 % (ref 11.5–15.5)
WBC: 7.2 10*3/uL (ref 4.0–10.5)

## 2015-08-15 LAB — GLUCOSE, CAPILLARY: GLUCOSE-CAPILLARY: 71 mg/dL (ref 65–99)

## 2015-08-15 MED ORDER — HYDROXYZINE HCL 25 MG PO TABS: 25.0000 mg | ORAL_TABLET | Freq: Three times a day (TID) | ORAL | Status: DC | PRN

## 2015-08-15 MED FILL — ?HYDROXYZINE HCL 25 MG TAB: 25 | 5 days supply | Qty: 15 | Fill #0

## 2015-08-15 NOTE — Assessment & Plan Note (Signed)
-   Description of itching without abdominal pain, nausea or vomiting and itching limited to nighttime is not particularly concerning for cholestasis. - Prescribed hydroxyzine 25 mg (#15 pills) for patient to try at night. Advised patient to return prior to 2-week follow-up appointment if itching gets worse or if she has abdominal pain, nausea or vomiting, at which point blood work for bile acids may be needed.

## 2015-08-15 NOTE — Patient Instructions (Addendum)
Sra. Karla Reynolds,  Gracias por venir hoy.  Para su picazn, he enviado una receta para un medicamento llamado hidroxizina. Es posible que desee probar esto alrededor de la hora de [place]. Si la picazn empeora, por favor, haga una cita para ser visto, ya que puede ser necesario [employer].  Por favor, obtenga una vacuna Tdap lo ms pronto posible. El nmero para el departamento de [place] [phone]. Si usted puede ser un registro de conseguir este tiro a una cita futura, que sera muy til.  Para obtener ms informacin [employer] de parto, visite [email or web address].  Mejor, Dr. Sampson Goon  Tercer trimestre de embarazo (Third Trimester of Pregnancy) El tercer trimestre comprende desde la semana29 [employer] semana42, es decir, desde el mes7 hasta el [address] En este trimestre, el feto crece muy rpido. Hacia el final del noveno mes, el feto mide alrededor de 20pulgadas (45cm) de largo y pesa entre 6y 10libras [id number]).  CUIDADOS EN EL HOGAR   No fume, no consuma hierbas ni beba alcohol. No tome frmacos que el mdico no haya autorizado.  No consuma ningn producto que contenga tabaco, lo que incluye cigarrillos, tabaco de Theatre manager o Administrator, Civil Service. Si necesita ayuda para dejar de fumar, consulte al [employer]. Puede recibir asesoramiento u otro tipo de apoyo para dejar de fumar.  Tome los medicamentos solamente como se lo haya indicado el mdico. Algunos medicamentos son seguros para tomar durante el Psychiatrist y otros no lo son.  Haga ejercicios solamente como se lo haya indicado el mdico. Interrumpa la actividad fsica si comienza a tener calambres.  Ingiera alimentos saludables de [place] regular.  Use un sostn que le brinde buen soporte si sus mamas estn sensibles.  No se d baos de inmersin en agua caliente, baos turcos ni saunas.  Colquese el cinturn de seguridad cuando conduzca.  No coma carne cruda ni queso sin cocinar; evite  el contacto con las bandejas sanitarias de los gatos y la tierra que estos animales usan.  Tome las vitaminas prenatales.  Tome entre 1500 y  de calcio diariamente comenzando en la semana20 del embarazo [place].  Pruebe tomar un medicamento que la ayude a defecar (un laxante suave) si el mdico lo autoriza. Consuma ms fibra, que se encuentra en las frutas y verduras frescas y los cereales integrales. Beba suficiente lquido para mantener el pis (orina) claro o de color amarillo plido.  Dese baos de asiento con agua tibia para Engineer, materials o las molestias causadas por las hemorroides. Use una crema para las hemorroides si el mdico la autoriza.  Si se le hinchan las venas (venas varicosas), use medias de descanso. Levante (eleve) los pies durante , 3 o 4veces por Futures trader. Limite el consumo de sal en su dieta.  No levante objetos pesados, use zapatos de tacones bajos y sintese derecha.  Descanse con las piernas elevadas si tiene calambres o dolor de cintura.  Visite a su dentista si no lo ha Occupational hygienist. Use un cepillo de cerdas suaves para cepillarse los dientes. Psese el hilo dental con suavidad.  Puede seguir [employer], a menos que el mdico le indique lo contrario.  No haga viajes de larga distancia, excepto si es obligatorio y solamente con la aprobacin del mdico.  Tome clases prenatales.  Practique ir manejando al hospital.  Prepare el bolso que llevar al hospital.  Prepare la habitacin del beb.  Concurra a los controles mdicos. SOLICITE AYUDA SI:  No est  segura de si est en trabajo de parto o si ha roto la bolsa de las aguas.  Tiene mareos.  Siente calambres leves o presin en la parte inferior del abdomen.  Sufre un dolor persistente en el abdomen.  Tiene Programme researcher, broadcasting/film/video (nuseas), vmitos, o tiene deposiciones acuosas (diarrea).  Advierte un olor ftido que proviene de la vagina.  Siente  dolor al [employer]. SOLICITE AYUDA DE INMEDIATO SI:   Tiene fiebre.  Tiene una prdida de lquido por la vagina.  Tiene sangrado o pequeas prdidas vaginales.  Siente dolor intenso o clicos en el abdomen.  Sube o baja de peso rpidamente.  Tiene dificultades para recuperar el aliento y siente dolor en el pecho.  Sbitamente se le hinchan mucho el rostro, las [place], los tobillos, los pies o las piernas.  No ha sentido los movimientos del beb durante Georgianne Fick.  Siente un dolor de cabeza intenso que no se alivia con medicamentos.  Su visin se modifica.   Esta informacin no tiene Theme park manager el consejo del mdico. Asegrese de hacerle al mdico cualquier pregunta que tenga.   Document Released: 03/15/2013 Document Revised: 08/03/2014 Elsevier Interactive Patient Education [employer] Inc.

## 2015-08-15 NOTE — Assessment & Plan Note (Addendum)
-   Advised patient to get Tdap at health department for lowest cost. Provided telephone number.  - Recommended patient eat 3 regular meals a day and decrease the amount of juice she is drinking to have a more balanced diet.

## 2015-08-15 NOTE — Progress Notes (Signed)
Karla Reynolds is a 35 y.o. G3P2002 at [redacted]w[redacted]d for routine follow up.  She reports she is feeling well.  See flow sheet for details.  A/P: Pregnancy at [redacted]w[redacted]d.  Doing well.   Pregnancy issues include itching at night for the past 4 days. She has itching of her back, belly and upper arms. Denies abdominal pain, nausea or vomiting. She also reports that she has been eating just 1 meal a day (lunch) for the past month and then making her own fruit juices (pineapple, orange) instead of having regular meals. She denies decreased appetite.   Infant feeding choice breast and bottle. Contraception choice IUD (Copper vs. Mirena?) Infant circumcision desired - N/A. Having a girl.  Tdapwas not given today.  1 hour glucola, CBC, RPR, and HIV were done today.   RH status was reviewed and pt does not need Rhogam.  Rhogam was not given today.   Childbirth and education classes were offered. Preterm labor precautions reviewed. Kick counts reviewed. Follow up 2 weeks.   Itching - Description of itching without abdominal pain, nausea or vomiting and itching limited to nighttime is not particularly concerning for cholestasis. - Prescribed hydroxyzine 25 mg (#15 pills) for patient to try at night. Advised patient to return prior to 2-week follow-up appointment if itching gets worse or if she has abdominal pain, nausea or vomiting, at which point blood work for bile acids may be needed.   Encounter for supervision of other normal pregnancy - Advised patient to get Tdap at health department for lowest cost. Provided telephone number.  - Recommended patient eat 3 regular meals a day and decrease the amount of juice she is drinking to have a more balanced diet.    Dani Gobble, MD [facility], PGY-1

## 2015-08-15 NOTE — Progress Notes (Signed)
[facility]/OB ATTENDING  NOTE Kehinde Eniola,MD I  have seen and examined this patient, reviewed their chart. I have discussed this patient with the resident. I agree with the resident's findings, assessment and care plan. Her concern today is about mild itching mostly at night, she denies change in skin color or change in sclera color. She is currently a symptomatic. Itching is not intense enough to affect her sleep. She denies any other concern. Pregnancy seems to be progressing well and I agree with Dr. Sampson Goon assessment and plan. Patient instructed to f/u soon if itching persists or worsens despite conservative measures otherwise continue routine prenatal care.

## 2015-08-16 ENCOUNTER — Telehealth: Payer: Self-pay | Admitting: Family Medicine

## 2015-08-16 LAB — HIV ANTIBODY (ROUTINE TESTING W REFLEX): HIV: NONREACTIVE

## 2015-08-16 LAB — RPR

## 2015-08-16 NOTE — Telephone Encounter (Signed)
Call back about test result. 

## 2015-08-19 ENCOUNTER — Ambulatory Visit: Payer: Self-pay

## 2015-08-22 ENCOUNTER — Ambulatory Visit: Payer: Self-pay | Attending: Internal Medicine | Admitting: Pharmacist

## 2015-08-22 DIAGNOSIS — Z23 Encounter for immunization: Secondary | ICD-10-CM | POA: Insufficient documentation

## 2015-08-22 DIAGNOSIS — Z0000 Encounter for general adult medical examination without abnormal findings: Secondary | ICD-10-CM

## 2015-08-22 MED ORDER — TETANUS-DIPHTH-ACELL PERTUSSIS 5-2.5-18.5 LF-MCG/0.5 IM SUSP
0.5000 mL | Freq: Once | INTRAMUSCULAR | Status: AC
  Administered 2015-08-22: 0.5 mL via INTRAMUSCULAR

## 2015-08-22 NOTE — Progress Notes (Signed)
Patient presents for TDAP. She refuses influenza vaccination at this time.

## 2015-08-22 NOTE — Patient Instructions (Signed)
Vacuna Tdap (contra la difteria, el ttanos y [place]): Lo que debe saber (Tdap Vaccine [Tetanus, Diphtheria, and Pertussis]: What You Need to Know) 1. Por qu vacunarse? El ttanos, la difteria y la tosferina son enfermedades muy graves. La vacuna Tdap nos puede proteger de estas enfermedades. Adems, la vacuna Tdap que se aplica a las Chemical engineer a los bebs recin nacidos contra la tosferina. En la actualidad, el [place] (trismo) es una enfermedad poco frecuente en los [place]. Provoca la contraccin y el endurecimiento dolorosos de los msculos, por lo general, de todo el cuerpo.  Puede causar el endurecimiento de los msculos de la cabeza y el cuello, de modo que impide abrir la boca, tragar y en algunos casos, Ambulance person. El ttanos causa la muerte de aproximadamente 1de cada 10personas que contraen la infeccin, incluso despus de que reciben la mejor atencin mdica. La DIFTERIA tambin es poco frecuente en los [place] [employer]. Puede causar la formacin de una membrana gruesa en la parte posterior de la garganta.  Esto tiene como consecuencia problemas respiratorios, insuficiencia cardaca, parlisis y [place]. La TOSFERINA (tos convulsa) provoca episodios de tos intensa que pueden dificultar la respiracin y provocar vmitos y trastornos del sueo.  Tambin puede causar prdida de peso, incontinencia y fractura de [place]. Dos de cada 100 adolescentes y 5 de cada 100 adultos con tosferina deben ser hospitalizados o tienen complicaciones, que podran incluir neumona y [place]. Estas enfermedades son provocadas por bacterias. La difteria y la tosferina se contagian de Ardelia Mems persona a otra a travs de las secreciones de la tos o el estornudo. El ttanos ingresa al organismo a travs de cortes, rasguos o heridas. Antes de las vacunas, en los [place] se informaban 200000 casos de difteria, 200000 casos de tosferina y cientos de casos de ttanos cada  ao. Desde el inicio de la vacunacin, los informes de casos de ttanos y difteria han disminuido alrededor del 99%, y de tosferina, alrededor del 80%. 2. Edward Jolly Tdap La vacuna Tdap protege a adolescentes y adultos contra el ttanos, la difteria y la tosferina. Una dosis de Tdap se administra a los 80 o 12 aos. Las [place] [employer] no recibieron la vacuna Tdap a esa edad deben recibirla tan pronto como sea posible. Es muy importante que los mdicos y todos aquellos que tengan contacto cercano con bebs menores de 62meses reciban la vacuna Tdap. Las mujeres deben recibir una dosis de Tdap en cada Media planner, para proteger al recin nacido de la tosferina. Los nios tienen mayor riesgo de complicaciones graves y potencialmente mortales debido a la tosferina. Otra vacuna llamada Td protege contra el ttanos y la difteria, pero no contra la tosferina. Todos deben recibir una dosis de refuerzo de Td cada 10 aos. La Tdap puede aplicarse como uno de estos refuerzos si nunca antes recibi esta vacuna. Tambin se puede aplicar despus de un corte o quemadura grave para prevenir la infeccin por ttanos. El mdico o la persona que le aplique la vacuna puede darle ms informacin al [employer]. La Tdap puede administrarse de manera segura simultneamente con otras vacunas. 3. Algunas personas no deben recibir la [employer] persona que alguna vez tuvo una reaccin alrgica potencialmente mortal a Ardelia Mems dosis previa de cualquier vacuna contra el ttanos, la difteria o la tosferina, O que tenga una alergia grave a cualquiera de los componentes de esta vacuna, no debe recibir la vacuna Tdap. Informe a la Web designer la vacuna si tiene  cualquier alergia grave.  Una persona que estuvo en estado de coma o sufri mltiples convulsiones en el trmino de los 7das despus de recibir una dosis de DTP o DTaP, o una dosis previa de Tdap, no debe recibir la vacuna Tdap, salvo que se haya encontrado otra causa que no fuera  la vacuna. An puede recibir la Td.  Consulte con su mdico si:  tiene convulsiones u otro problema del sistema nervioso,  tuvo hinchazn o dolor intenso despus de cualquier vacuna contra la difteria o el ttanos,  alguna vez ha sufrido el sndrome de [place],  no se siente Pharmacologist en que se ha programado la vacuna. 4. Riesgos Con cualquier medicamento, incluyendo las vacunas, existe la posibilidad de que aparezcan efectos secundarios. Suelen ser leves y desaparecen por s solos. Tambin son posibles las reacciones graves, pero en raras ocasiones. La [employer] de las personas a las que se les aplica la vacuna Tdap no tienen ningn problema. Problemas leves despus de la vacuna Tdap (No interfirieron en otras actividades)  Dolor en el lugar donde se aplic la vacuna (alrededor de 3 de cada 4 adolescentes o 2 de cada 3 adultos).  Enrojecimiento o hinchazn en el lugar donde se aplic la vacuna (1 de cada 5 personas).  Fiebre leve de al menos 100,21F (38C) (hasta alrededor de 1 cada 25 adolescentes o 1 de cada 100 adultos).  Dolor de cabeza (alrededor de 3 o 4 de cada 10 personas).  Cansancio (alrededor de 1 de cada 3 o 4 personas).  Nuseas, vmitos, diarrea, dolor de estmago (hasta 1 de cada 4 adolescentes o 1 de cada 10 adultos).  Escalofros, dolores articulares (alrededor de 1de cada 10personas).  Dolores corporales (alrededor de 1de cada 3 o 4personas).  Erupcin cutnea, inflamacin de los ganglios (poco frecuente). Problemas moderados despus de recibir la vacuna Tdap (Interfirieron en otras actividades, pero no requirieron atencin mdica)  Management consultant donde se aplic la vacuna (hasta 1de cada 5 o 6).  Enrojecimiento o inflamacin en el lugar donde se aplic la vacuna (hasta alrededor de 1 de cada 16adolescentes o 1 de cada 12adultos).  Fiebre de ms de 102F (38,8C) (alrededor de 1 de cada 100 adolescentes o 1 de cada 250  adultos).  Dolor de cabeza (alrededor de 1de cada 7adolescentes o 1de cada 10adultos).  Nuseas, vmitos, diarrea, dolor de estmago (hasta 1 o 3 de cada 100 personas).  Hinchazn de todo el brazo en el que se aplic la vacuna (hasta alrededor de 1de cada 500personas). Problemas graves despus de la vacuna Tdap (Impidieron Optometrist las actividades habituales; requirieron atencin mdica)  Inflamacin, dolor intenso, sangrado y enrojecimiento en el brazo en que se aplic la vacuna (poco frecuente). Problemas que podran ocurrir despus de cualquier vacuna:  Las personas a veces se desmayan despus de un procedimiento mdico, incluida la vacunacin. Si permanece sentado o recostado durante 15 minutos puede ayudar a [employer] y las lesiones causadas por las cadas. Informe al mdico si se siente mareado, tiene cambios en la visin o zumbidos en los odos.  Algunas personas sienten un dolor intenso en el hombro y tienen dificultad para mover el brazo donde se coloc la vacuna. Esto sucede con muy poca frecuencia.  Cualquier medicamento puede causar una reaccin alrgica grave. Dichas reacciones son Orlene Erm poco frecuentes con una vacuna (se calcula que menos de 1en un milln de dosis) y se producen de unos minutos a unas horas despus de Writer. Al  igual que con cualquier medicamento, existe una probabilidad muy remota de que una vacuna cause una lesin grave o la muerte. Se controla permanentemente la seguridad de las vacunas. Para obtener ms informacin, visite: www.cdc.gov/vaccinesafety/. 5. Qu pasa si hay un problema grave? A qu signos debo estar atento?  Observe todo lo que le preocupe, como signos de una reaccin alrgica grave, fiebre muy alta o comportamiento fuera de lo normal.  Los signos de una reaccin alrgica grave pueden incluir ronchas, hinchazn de la cara y la garganta, dificultad para respirar, latidos cardacos acelerados, mareos y debilidad.  Generalmente, estos comenzaran entre unos pocos minutos y algunas horas despus de la vacunacin. Qu debo hacer?  Si usted piensa que se trata de una reaccin alrgica grave o de otra emergencia que no puede esperar, llame al 911 o lleve a la persona al hospital ms cercano. Sino, llame a su mdico.  Despus, la reaccin debe informarse al Sistema de Informacin sobre Efectos Adversos de las Vacunas (Vaccine Adverse Event Reporting System, VAERS). Su mdico puede presentar este informe, o puede hacerlo usted mismo a travs del sitio web de VAERS, en www.vaers.hhs.gov, o llamando al 1-800-822-7967. VAERS no brinda recomendaciones mdicas. 6. Programa Nacional de Compensacin de Daos por Vacunas El Programa Nacional de Compensacin de Daos por Vacunas (National Vaccine Injury Compensation Program, VICP) es un programa federal que fue creado para compensar a las personas que puedan haber sufrido daos al recibir ciertas vacunas. Aquellas personas que consideren que han sufrido un dao como consecuencia de una vacuna y quieran saber ms acerca del programa y de cmo presentar una denuncia, pueden llamar al 1-800-338-2382 o visitar su sitio web en www.hrsa.gov/vaccinecompensation. Hay un lmite de tiempo para presentar un reclamo de compensacin. 7. Cmo puedo obtener ms informacin?  Consulte a su mdico. Este puede darle el prospecto de la vacuna o recomendarle otras fuentes de informacin.  Comunquese con el servicio de salud de su localidad o su estado.  Comunquese con los Centros para el Control y la Prevencin de Enfermedades (Centers for Disease Control and Prevention , CDC).  Llame al 1-800-232-4636 (1-800-CDC-INFO), o  visite el sitio web de los CDC en www.cdc.gov/vaccines. Declaracin de informacin sobre la vacuna contra la difteria, el ttanos y la tosferina (Tdap) de los CDC (24/02/15)   Esta informacin no tiene como fin reemplazar el consejo del mdico. Asegrese de hacerle  al mdico cualquier pregunta que tenga.   Document Released: 06/29/2012 Document Revised: 08/03/2014 Elsevier Interactive Patient Education 2016 Elsevier Inc.  

## 2015-08-28 ENCOUNTER — Ambulatory Visit (INDEPENDENT_AMBULATORY_CARE_PROVIDER_SITE_OTHER): Payer: Self-pay | Admitting: Family Medicine

## 2015-08-28 VITALS — BP 100/63 | HR 90 | Temp 97.6°F | Wt 168.2 lb

## 2015-08-28 DIAGNOSIS — Z331 Pregnant state, incidental: Secondary | ICD-10-CM

## 2015-08-28 DIAGNOSIS — Z3493 Encounter for supervision of normal pregnancy, unspecified, third trimester: Secondary | ICD-10-CM

## 2015-08-28 NOTE — Patient Instructions (Signed)
Tercer trimestre de embarazo  (Third Trimester of Pregnancy)  El tercer trimestre comprende desde la semana 29 hasta la semana 42, es decir, desde el mes 7 hasta el mes 9. En este trimestre, el feto crece muy rápido. Hacia el final del noveno mes, el feto mide alrededor de 20 pulgadas (45 cm) de largo y pesa entre 6 y 10 libras (2,700 y 4,500 kg).   CUIDADOS EN EL HOGAR   · No fume, no consuma hierbas ni beba alcohol. No tome fármacos que el médico no haya autorizado.  · No consuma ningún producto que contenga tabaco, lo que incluye cigarrillos, tabaco de mascar o cigarrillos electrónicos. Si necesita ayuda para dejar de fumar, consulte al médico. Puede recibir asesoramiento u otro tipo de apoyo para dejar de fumar.  · Tome los medicamentos solamente como se lo haya indicado el médico. Algunos medicamentos son seguros para tomar durante el embarazo y otros no lo son.  · Haga ejercicios solamente como se lo haya indicado el médico. Interrumpa la actividad física si comienza a tener calambres.  · Ingiera alimentos saludables de manera regular.  · Use un sostén que le brinde buen soporte si sus mamas están sensibles.  · No se dé baños de inmersión en agua caliente, baños turcos ni saunas.  · Colóquese el cinturón de seguridad cuando conduzca.  · No coma carne cruda ni queso sin cocinar; evite el contacto con las bandejas sanitarias de los gatos y la tierra que estos animales usan.  · Tome las vitaminas prenatales.  · Tome entre 1500 y 2000 mg de calcio diariamente comenzando en la semana 20 del embarazo hasta el parto.  · Pruebe tomar un medicamento que la ayude a defecar (un laxante suave) si el médico lo autoriza. Consuma más fibra, que se encuentra en las frutas y verduras frescas y los cereales integrales. Beba suficiente líquido para mantener el pis (orina) claro o de color amarillo pálido.  · Dese baños de asiento con agua tibia para aliviar el dolor o las molestias causadas por las hemorroides. Use una crema  para las hemorroides si el médico la autoriza.  · Si se le hinchan las venas (venas varicosas), use medias de descanso. [place] (eleve) los pies durante 15 minutos, 3 o 4 veces por día. Limite el consumo de sal en su dieta.  · No levante objetos pesados, use zapatos de tacones bajos y siéntese derecha.  · Descanse con las piernas elevadas si tiene calambres o dolor de cintura.  · Visite a su dentista si no lo ha hecho durante el embarazo. Use un cepillo de cerdas suaves para cepillarse los dientes. Pásese el hilo dental con suavidad.  · Puede seguir manteniendo relaciones sexuales, a menos que el médico le indique lo contrario.  · No haga viajes de larga distancia, excepto si es obligatorio y solamente con la aprobación del médico.  · Tome clases prenatales.  · Practique ir manejando al hospital.  · Prepare el bolso que llevará al hospital.  · Prepare la habitación del bebé.  · Concurra a los controles médicos.  SOLICITE AYUDA SI:  · No está segura de si está en trabajo de parto o si ha roto la bolsa de las aguas.  · Tiene mareos.  · Siente calambres leves o presión en la parte inferior del abdomen.  · Sufre un dolor persistente en el abdomen.  · Tiene malestar estomacal (náuseas), vómitos, o tiene deposiciones acuosas (diarrea).  · Advierte un olor fétido que proviene de la vagina.  · Siente dolor al orinar.  SOLICITE AYUDA DE INMEDIATO SI:   ·   Tiene fiebre.  · Tiene una pérdida de líquido por la vagina.  · Tiene sangrado o pequeñas pérdidas vaginales.  · Siente dolor intenso o cólicos en el abdomen.  · Sube o baja de peso rápidamente.  · Tiene dificultades para recuperar el aliento y siente dolor en el pecho.  · Súbitamente se le hinchan mucho el rostro, las manos, los tobillos, los pies o las piernas.  · No ha sentido los movimientos del bebé durante una hora.  · Siente un dolor de cabeza intenso que no se alivia con medicamentos.  · Su visión se modifica.     Esta información no tiene como fin reemplazar el  consejo del médico. Asegúrese de hacerle al médico cualquier pregunta que tenga.     Document Released: 03/15/2013 Document Revised: 08/03/2014  Elsevier Interactive Patient Education ©2016 Elsevier Inc.

## 2015-08-28 NOTE — Progress Notes (Signed)
Karla Reynolds is a 35 y.o. G3P2 at [redacted]w[redacted]d for routine follow up.  She reports lower abdominal pain that radiates into back. No dysuria. Pain worse with movement. No medications tried. Also has occasional contractions.  See flow sheet for details.  A/P:   1) Pregnancy at [redacted]w[redacted]d.  Doing well.   28 week labs reviewed: - 1 hour GTT normal - RPR, HIV negative Preterm labor precautions reviewed. Kick counts reviewed. Follow up 2 weeks.  2) Abdominal / back pain. Likely round ligament pain. Cervix closed. - Recommended tylenol as needed. - Recommended maternity support belt

## 2015-09-13 ENCOUNTER — Ambulatory Visit (INDEPENDENT_AMBULATORY_CARE_PROVIDER_SITE_OTHER): Payer: Self-pay | Admitting: Family Medicine

## 2015-09-13 VITALS — BP 110/64 | HR 72 | Temp 98.1°F | Wt 173.4 lb

## 2015-09-13 DIAGNOSIS — R42 Dizziness and giddiness: Secondary | ICD-10-CM

## 2015-09-13 DIAGNOSIS — R350 Frequency of micturition: Secondary | ICD-10-CM

## 2015-09-13 LAB — POCT URINALYSIS DIPSTICK
Bilirubin, UA: NEGATIVE
Blood, UA: NEGATIVE
Glucose, UA: NEGATIVE
Ketones, UA: NEGATIVE
Leukocytes, UA: NEGATIVE
NITRITE UA: NEGATIVE
PROTEIN UA: NEGATIVE
SPEC GRAV UA: 1.02
UROBILINOGEN UA: 0.2
pH, UA: 6.5

## 2015-09-13 LAB — POCT HEMOGLOBIN: Hemoglobin: 10.2 g/dL — AB (ref 12.2–16.2)

## 2015-09-13 NOTE — Patient Instructions (Signed)
Hemorragia vaginal durante el embarazo (tercer trimestre) (Vaginal Bleeding During Pregnancy, Third Trimester)  Durante el embarazo, es comn tener una pequea hemorragia vaginal (manchas). A veces, la hemorragia es normal y no representa un problema, pero en algunas ocasiones es un sntoma de algo grave. Asegrese de decirle a su mdico de inmediato si tiene algn tipo de hemorragia vaginal. CUIDADOS EN EL HOGAR  Controle su afeccin para ver si hay cambios.  Siga las indicaciones de su mdico con respecto al [place] de actividad que [place].  Si debe hacer reposo en cama:  Es posible que deba quedarse en cama y levantarse nicamente para ir al bao.  Quizs le permitan hacer [employer].  Si es necesario, planifique que alguien la ayude.  Marcelino Fre[place]:  La cantidad de toallas higinicas que [place] cada da.  La frecuencia con la que se cambia las toallas higinicas.  Indique que tan empapados (saturados) estn.  No use tampones.  No se haga duchas vaginales.  No tenga relaciones sexuales ni orgasmos hasta que el mdico la autorice.  Siga los consejos de su mdico acerca de levantar objetos pesados, conducir automviles y Education officer, environmental actividad fsica.  Si elimina tejido por la vagina, gurdelo para mostrrselo al [employer].  Tome los medicamentos solamente como se lo haya indicado el mdico.  No tome aspirina, ya que puede causar hemorragias.  Concurra a todas las visitas de control como se lo haya indicado el mdico. SOLICITE AYUDA SI:   Tiene una hemorragia vaginal.  Tiene clicos.  Tiene dolores de [place].  Tiene fiebre que no desaparece despus de Teacher, adult education. SOLICITE AYUDA DE INMEDIATO SI:  Siente clicos muy intensos en la espalda o en el vientre (abdomen).  Tiene escalofros.  Tiene una prdida de lquido por la vagina.  Elimina cogulos grandes o tejido por la vagina.  Tiene ms hemorragia.  Se siente dbil o que va a desvanecerse.  Pierde el  conocimiento (se desmaya).  No siente que el beb se mueva tanto como antes. ASEGRESE DE QUE:  Comprende estas instrucciones.  Controlar su afeccin.  Recibir ayuda de inmediato si no mejora o si empeora.   Esta informacin no tiene Theme park manager el consejo del mdico. Asegrese de hacerle al mdico cualquier pregunta que tenga.   Document Released: 11/27/2013 Elsevier Interactive Patient Education [employer]

## 2015-09-13 NOTE — Progress Notes (Signed)
Karla Reynolds is a 35 y.o. G3P2 at [redacted]w[redacted]d for routine follow up.  She reports some dizziness and urinary frequency.  Subjective:  Dizziness Started 2 weeks ago. Feels like the room is spinning. Feels like she is going to pass out. Lasts for about 30-45 minutes then subsides. Recently started drinking decaffeinated coffee. Occasional palpitations. Improves after drinking milk.   Urinary Frequency Feels increased pressure in her vagina like she has to urinate. Sometimes urinates a very small amount, but otherwise is able to void normally. No dysuria. No hematuria. No fevers or chills. Worse with walking.   See flow sheet for further details.   Objective: Blood pressure 110/64, pulse 72, temperature 98.1 F (36.7 C), weight 173 lb 6.4 oz (78.654 kg), last menstrual period 01/29/2015. CV: RRR Pulm: NWOB, CTAB GI: S, gravid uterus, nondistended GU: Normal external female genitalia.  -Cervix: 1/ thick   A/P:  1) Pregnancy at [redacted]w[redacted]d.  Doing well.   Preterm labor precautions reviewed. Safe sleep discussed. Kick counts reviewed. Follow up 2 weeks.  2) Dizziness  Likely related to caffeine intake given that it occurs after drinking coffee.  Orthostatics here normal. Hgb 10.2 Instructed patient to stop drinking coffee and drink at least 8 glasses of water daily Return precautions reviewed - follow up in 2 weeks  3) Urinary Frequency Likely due to pressure of enlarging uterus on bladder UA normal today Cervix dilated to 1cm today. Return precautions reviewed - follow up in 2 weeks.

## 2015-09-27 ENCOUNTER — Ambulatory Visit (INDEPENDENT_AMBULATORY_CARE_PROVIDER_SITE_OTHER): Payer: Self-pay | Admitting: Family Medicine

## 2015-09-27 VITALS — BP 114/68 | HR 79 | Temp 97.5°F | Wt 176.0 lb

## 2015-09-27 DIAGNOSIS — O2311 Infections of bladder in pregnancy, first trimester: Secondary | ICD-10-CM

## 2015-09-27 DIAGNOSIS — Z3481 Encounter for supervision of other normal pregnancy, first trimester: Secondary | ICD-10-CM

## 2015-09-27 DIAGNOSIS — Z3483 Encounter for supervision of other normal pregnancy, third trimester: Secondary | ICD-10-CM

## 2015-09-27 NOTE — Patient Instructions (Signed)
Tercer trimestre de embarazo (Third Trimester of Pregnancy) El tercer trimestre comprende desde la semana29 hasta la semana42, es decir, desde el mes7 hasta el mes9. El tercer trimestre es un perodo en el que el feto crece rpidamente. Hacia el final del noveno mes, el feto mide alrededor de 20pulgadas (45cm) de largo y pesa entre 6 y 10 libras (2,700 y 4,500kg).  CAMBIOS EN EL ORGANISMO Su organismo atraviesa por muchos cambios durante el embarazo, y estos varan de una mujer a otra.   Seguir aumentando de peso. Es de esperar que aumente entre 25 y 35libras (11 y 16kg) hacia el final del embarazo.  Podrn aparecer las primeras estras en las caderas, el abdomen y las mamas.  Puede tener necesidad de orinar con ms frecuencia porque el feto baja hacia la pelvis y ejerce presin sobre la vejiga.  Debido al embarazo podr sentir acidez estomacal con frecuencia.  Puede estar estreida, ya que ciertas hormonas enlentecen los movimientos de los msculos que empujan los desechos a travs de los intestinos.  Pueden aparecer hemorroides o abultarse e hincharse las venas (venas varicosas).  Puede sentir dolor plvico debido al aumento de peso y a que las hormonas del embarazo relajan las articulaciones entre los huesos de la pelvis. El dolor de espalda puede ser consecuencia de la sobrecarga de los msculos que soportan la postura.  Tal vez haya cambios en el cabello que pueden incluir su engrosamiento, crecimiento rpido y cambios en la textura. Adems, a algunas mujeres se les cae el cabello durante o despus del embarazo, o tienen el cabello seco o fino. Lo ms probable es que el cabello se le normalice despus del nacimiento del beb.  Las mamas seguirn creciendo y le dolern. A veces, puede haber una secrecin amarilla de las mamas llamada calostro.  El ombligo puede salir hacia afuera.  Puede sentir que le falta el aire debido a que se expande el tero.  Puede notar que el feto  "baja" o lo siente ms bajo, en el abdomen.  Puede tener una prdida de secrecin mucosa con sangre. Esto suele ocurrir en el trmino de unos pocos das a una semana antes de que comience el trabajo de parto.  El cuello del tero se vuelve delgado y blando (se borra) cerca de la fecha de parto. QU DEBE ESPERAR EN LOS EXMENES PRENATALES  Le harn exmenes prenatales cada 2semanas hasta la semana36. A partir de ese momento le harn exmenes semanales. Durante una visita prenatal de rutina:  La pesarn para asegurarse de que usted y el feto estn creciendo normalmente.  Le tomarn la presin arterial.  Le medirn el abdomen para controlar el desarrollo del beb.  Se escucharn los latidos cardacos fetales.  Se evaluarn los resultados de los estudios solicitados en visitas anteriores.  Le revisarn el cuello del tero cuando est prxima la fecha de parto para controlar si este se ha borrado. Alrededor de la semana36, el mdico le revisar el cuello del tero. Al mismo tiempo, realizar un anlisis de las secreciones del tejido vaginal. Este examen es para determinar si hay un tipo de bacteria, estreptococo Grupo B. El mdico le explicar esto con ms detalle. El mdico puede preguntarle lo siguiente:  Cmo le gustara que fuera el parto.  Cmo se siente.  Si siente los movimientos del beb.  Si ha tenido sntomas anormales, como prdida de lquido, sangrado, dolores de cabeza intensos o clicos abdominales.  Si est consumiendo algn producto que contenga tabaco, como cigarrillos, tabaco   de mascar y cigarrillos electrnicos.  Si tiene alguna pregunta. Otros exmenes o estudios de deteccin que pueden realizarse durante el tercer trimestre incluyen lo siguiente:  Anlisis de sangre para controlar los niveles de hierro (anemia).  Controles fetales para determinar su salud, nivel de actividad y crecimiento. Si tiene alguna enfermedad o hay problemas durante el embarazo, le harn  estudios.  Prueba del VIH (virus de inmunodeficiencia humana). Si corre un riesgo alto, pueden realizarle una prueba de deteccin del VIH durante el tercer trimestre del embarazo. FALSO TRABAJO DE PARTO Es posible que sienta contracciones leves e irregulares que finalmente desaparecen. Se llaman contracciones de Braxton Hicks o falso trabajo de parto. Las contracciones pueden durar horas, das o incluso semanas, antes de que el verdadero trabajo de parto se inicie. Si las contracciones ocurren a intervalos regulares, se intensifican o se hacen dolorosas, lo mejor es que la revise el mdico.  SIGNOS DE TRABAJO DE PARTO   Clicos de tipo menstrual.  Contracciones cada 5minutos o menos.  Contracciones que comienzan en la parte superior del tero y se extienden hacia abajo, a la zona inferior del abdomen y la espalda.  Sensacin de mayor presin en la pelvis o dolor de espalda.  Una secrecin de mucosidad acuosa o con sangre que sale de la vagina. Si tiene alguno de estos signos antes de la semana37 del embarazo, llame a su mdico de inmediato. Debe concurrir al hospital para que la controlen inmediatamente. INSTRUCCIONES PARA EL CUIDADO EN EL HOGAR   Evite fumar, consumir hierbas, beber alcohol y tomar frmacos que no le hayan recetado. Estas sustancias qumicas afectan la formacin y el desarrollo del beb.  No consuma ningn producto que contenga tabaco, lo que incluye cigarrillos, tabaco de mascar y cigarrillos electrnicos. Si necesita ayuda para dejar de fumar, consulte al mdico. Puede recibir asesoramiento y otro tipo de recursos para dejar de fumar.  Siga las indicaciones del mdico en relacin con el uso de medicamentos. Durante el embarazo, hay medicamentos que son seguros de tomar y otros que no.  Haga ejercicio solamente como se lo haya indicado el mdico. Sentir clicos uterinos es un buen signo para detener la actividad fsica.  Contine comiendo alimentos sanos con  regularidad.  Use un sostn que le brinde buen soporte si le duelen las mamas.  No se d baos de inmersin en agua caliente, baos turcos ni saunas.  Use el cinturn de seguridad en todo momento mientras conduce.  No coma carne cruda ni queso sin cocinar; evite el contacto con las bandejas sanitarias de los gatos y la tierra que estos animales usan. Estos elementos contienen grmenes que pueden causar defectos congnitos en el beb.  Tome las vitaminas prenatales.  Tome entre 1500 y 2000mg de calcio diariamente comenzando en la semana20 del embarazo hasta el parto.  Si est estreida, pruebe un laxante suave (si el mdico lo autoriza). Consuma ms alimentos ricos en fibra, como vegetales y frutas frescos y cereales integrales. Beba gran cantidad de lquido para mantener la orina de tono claro o color amarillo plido.  Dese baos de asiento con agua tibia para aliviar el dolor o las molestias causadas por las hemorroides. Use una crema para las hemorroides si el mdico la autoriza.  Si tiene venas varicosas, use medias de descanso. Eleve los pies durante 15minutos, 3 o 4veces por da. Limite el consumo de sal en su dieta.  Evite levantar objetos pesados, use zapatos de tacones bajos y mantenga una buena postura.  Descanse   con las piernas elevadas si tiene calambres o dolor de cintura.  Visite a su dentista si no lo ha hecho durante el embarazo. Use un cepillo de dientes blando para higienizarse los dientes y psese el hilo dental con suavidad.  Puede seguir manteniendo relaciones sexuales, a menos que el mdico le indique lo contrario.  No haga viajes largos excepto que sea absolutamente necesario y solo con la autorizacin del mdico.  Tome clases prenatales para entender, practicar y hacer preguntas sobre el trabajo de parto y el parto.  Haga un ensayo de la partida al hospital.  Prepare el bolso que llevar al hospital.  Prepare la habitacin del beb.  Concurra a todas  las visitas prenatales segn las indicaciones de su mdico. SOLICITE ATENCIN MDICA SI:  No est segura de que est en trabajo de parto o de que ha roto la bolsa de las aguas.  Tiene mareos.  Siente clicos leves, presin en la pelvis o dolor persistente en el abdomen.  Tiene nuseas, vmitos o diarrea persistentes.  Observa una secrecin vaginal con mal olor.  Siente dolor al orinar. SOLICITE ATENCIN MDICA DE INMEDIATO SI:   Tiene fiebre.  Tiene una prdida de lquido por la vagina.  Tiene sangrado o pequeas prdidas vaginales.  Siente dolor intenso o clicos en el abdomen.  Sube o baja de peso rpidamente.  Tiene dificultad para respirar y siente dolor de pecho.  Sbitamente se le hinchan mucho el rostro, las manos, los tobillos, los pies o las piernas.  No ha sentido los movimientos del beb durante una hora.  Siente un dolor de cabeza intenso que no se alivia con medicamentos.  Su visin se modifica.   Esta informacin no tiene como fin reemplazar el consejo del mdico. Asegrese de hacerle al mdico cualquier pregunta que tenga.   Document Released: 04/22/2005 Document Revised: 08/03/2014 Elsevier Interactive Patient Education 2016 Elsevier Inc.  

## 2015-09-27 NOTE — Progress Notes (Signed)
Subjective:  Karla Reynolds is a 35 y.o. G3P2 at 3871w3d being seen today for ongoing prenatal care.  She is currently monitored for the following issues for this low-risk pregnancy and has Encounter for supervision of other normal pregnancy; Acute cystitis during pregnancy in first trimester; Encounter for supervision of other normal pregnancy in first trimester; and Itching on her problem list.  Patient reports no complaints.  Contractions: Irregular.  .  Movement: Present. Denies leaking of fluid.   The following portions of the patient's history were reviewed and updated as appropriate: allergies, current medications, past family history, past medical history, past social history, past surgical history and problem list. Problem list updated.  Objective:   Filed Vitals:   09/27/15 1533  BP: 114/68  Pulse: 79  Temp: 97.5 F (36.4 C)  Weight: 176 lb (79.833 kg)    Fetal Status: Fetal Heart Rate (bpm): 145 Fundal Height: 35 cm Movement: Present     General:  Alert, oriented and cooperative. Patient is in no acute distress.  Skin: Skin is warm and dry. No rash noted.   Cardiovascular: Normal heart rate noted  Respiratory: Normal respiratory effort, no problems with respiration noted  Abdomen: Soft, gravid, appropriate for gestational age. Pain/Pressure: Present     Pelvic:       Cervical exam deferred        Extremities: Normal range of motion.     Mental Status: Normal mood and affect. Normal behavior. Normal judgment and thought content.   Urinalysis:      Assessment and Plan:  Pregnancy: G3P2 at 2271w3d  1. Encounter for supervision of other normal pregnancy in third trimester -Updated pregnancy overview -confirmed BC plan  3. Acute cystitis during pregnancy in first trimester No need for ppx as the patient has not had sx   Preterm labor symptoms and general obstetric precautions including but not limited to vaginal bleeding, contractions, leaking of fluid and fetal  movement were reviewed in detail with the patient. Please refer to After Visit Summary for other counseling recommendations.  Return in about 2 weeks (around 10/11/2015) for Routine prenatal care.   Federico FlakeKimberly Niles Taos Tapp, MD

## 2015-10-14 ENCOUNTER — Encounter: Payer: Self-pay | Admitting: Family Medicine

## 2015-10-15 ENCOUNTER — Ambulatory Visit (INDEPENDENT_AMBULATORY_CARE_PROVIDER_SITE_OTHER): Payer: Self-pay | Admitting: Family Medicine

## 2015-10-15 VITALS — BP 120/66 | HR 74 | Temp 98.3°F | Wt 180.0 lb

## 2015-10-15 DIAGNOSIS — Z3483 Encounter for supervision of other normal pregnancy, third trimester: Secondary | ICD-10-CM

## 2015-10-15 LAB — OB RESULTS CONSOLE GBS: STREP GROUP B AG: NEGATIVE

## 2015-10-15 NOTE — Progress Notes (Signed)
Karla Reynolds is a 35 y.o. G3P2 at 6777w0d for routine follow up.  She reports lower abdominal pain x 10 days without radiation. No contractions. No LOF, VB. Notes good FM. No fevers, N/V/D.   See flow sheet for details.  A/P: Pregnancy at 7477w0d.  Doing well.   Pregnancy issues include: none  Infant feeding choice: breast Contraception choice: IUD Infant circumcision desired not applicable GBS: Obtained Labor precautions reviewed. Kick counts reviewed. F/u in 1 week

## 2015-10-15 NOTE — Patient Instructions (Signed)
Dolor abdominal en el embarazo  (Abdominal Pain During Pregnancy)  El dolor abdominal es frecuente durante el embarazo. Generalmente no causa ningún daño. El dolor abdominal puede tener numerosas causas. Algunas causas son más graves que otras. Ciertas causas de dolor abdominal durante el embarazo se diagnostican fácilmente. A veces, se tarda un tiempo para llegar al diagnóstico. Otras veces la causa no se conoce. El dolor abdominal puede estar relacionado con alguna alteración del embarazo, o puede deberse a una causa totalmente diferente. Por este motivo, siempre consulte a su médico cuando sienta molestias abdominales.  INSTRUCCIONES PARA EL CUIDADO EN EL HOGAR   Esté atenta al dolor para ver si hay cambios. Las siguientes indicaciones ayudarán a aliviar cualquier molestia que pueda sentir:  · No tenga relaciones sexuales y no coloque nada dentro de la vagina hasta que los síntomas hayan desaparecido completamente.  · Descanse todo lo que pueda hasta que el dolor se le haya calmado.  · Si siente náuseas, beba líquidos claros. Evite los alimentos sólidos mientras sienta malestar o tenga náuseas.  · Tome sólo medicamentos de venta libre o recetados, según las indicaciones del médico.  · Cumpla con todas las visitas de control, según le indique su médico.  SOLICITE ATENCIÓN MÉDICA DE INMEDIATO SI:  · Tiene un sangrado, pérdida de líquidos o elimina tejidos por la vagina.  · El dolor o los cólicos aumentan.  · Tiene vómitos persistentes.  · Comienza a sentir dolor al orinar u observa sangre.  · Tiene fiebre.  · Nota que los movimientos del bebé disminuyen.  · Siente intensa debilidad o se marea.  · Tiene dificultad para respirar con o sin dolor abdominal.  · Siente un dolor de cabeza intenso junto al dolor abdominal.  · Tiene una secreción vaginal anormal con dolor abdominal.  · Tiene diarrea persistente.  · El dolor abdominal sigue o empeora aún después de hacer reposo.  ASEGÚRESE DE QUE:   · Comprende estas  instrucciones.  · Controlará su afección.  · Recibirá ayuda de inmediato si no mejora o si empeora.     Esta información no tiene como fin reemplazar el consejo del médico. Asegúrese de hacerle al médico cualquier pregunta que tenga.     Document Released: 07/13/2005 Document Revised: 05/03/2013  Elsevier Interactive Patient Education ©2016 Elsevier Inc.

## 2015-10-17 LAB — CULTURE, BETA STREP (GROUP B ONLY)

## 2015-10-21 ENCOUNTER — Ambulatory Visit (INDEPENDENT_AMBULATORY_CARE_PROVIDER_SITE_OTHER): Payer: Self-pay | Admitting: Family Medicine

## 2015-10-21 VITALS — BP 112/70 | HR 77 | Temp 97.5°F | Wt 175.0 lb

## 2015-10-21 DIAGNOSIS — Z113 Encounter for screening for infections with a predominantly sexual mode of transmission: Secondary | ICD-10-CM

## 2015-10-21 MED ORDER — LORATADINE 10 MG PO TABS: 10.0000 mg | ORAL_TABLET | Freq: Every day | ORAL | Status: DC

## 2015-10-21 NOTE — Progress Notes (Signed)
Karla Reynolds is a 35 y.o. G3P2 at 2256w6d for routine follow up.  She reports good FM. Irregular contractions. No LOF or vaginal bleeding.  See flow sheet for details.  A/P: Pregnancy at 5656w6d.  Doing well.   Pregnancy issues include: None  Infant feeding choice: breast Contraception choice: IUD GBS negative GC/CT obtained today. Labor precautions reviewed. Kick counts reviewed. Follow up in 1 week.

## 2015-10-21 NOTE — Patient Instructions (Signed)
Tercer trimestre de embarazo  (Third Trimester of Pregnancy)  El tercer trimestre comprende desde la semana 29 hasta la semana 42, es decir, desde el mes 7 hasta el mes 9. En este trimestre, el feto crece muy rápido. Hacia el final del noveno mes, el feto mide alrededor de 20 pulgadas (45 cm) de largo y pesa entre 6 y 10 libras (2,700 y 4,500 kg).   CUIDADOS EN EL HOGAR   · No fume, no consuma hierbas ni beba alcohol. No tome fármacos que el médico no haya autorizado.  · No consuma ningún producto que contenga tabaco, lo que incluye cigarrillos, tabaco de mascar o cigarrillos electrónicos. Si necesita ayuda para dejar de fumar, consulte al médico. Puede recibir asesoramiento u otro tipo de apoyo para dejar de fumar.  · Tome los medicamentos solamente como se lo haya indicado el médico. Algunos medicamentos son seguros para tomar durante el embarazo y otros no lo son.  · Haga ejercicios solamente como se lo haya indicado el médico. Interrumpa la actividad física si comienza a tener calambres.  · Ingiera alimentos saludables de manera regular.  · Use un sostén que le brinde buen soporte si sus mamas están sensibles.  · No se dé baños de inmersión en agua caliente, baños turcos ni saunas.  · Colóquese el cinturón de seguridad cuando conduzca.  · No coma carne cruda ni queso sin cocinar; evite el contacto con las bandejas sanitarias de los gatos y la tierra que estos animales usan.  · Tome las vitaminas prenatales.  · Tome entre 1500 y 2000 mg de calcio diariamente comenzando en la semana 20 del embarazo hasta el parto.  · Pruebe tomar un medicamento que la ayude a defecar (un laxante suave) si el médico lo autoriza. Consuma más fibra, que se encuentra en las frutas y verduras frescas y los cereales integrales. Beba suficiente líquido para mantener el pis (orina) claro o de color amarillo pálido.  · Dese baños de asiento con agua tibia para aliviar el dolor o las molestias causadas por las hemorroides. Use una crema  para las hemorroides si el médico la autoriza.  · Si se le hinchan las venas (venas varicosas), use medias de descanso. [place] (eleve) los pies durante 15 minutos, 3 o 4 veces por día. Limite el consumo de sal en su dieta.  · No levante objetos pesados, use zapatos de tacones bajos y siéntese derecha.  · Descanse con las piernas elevadas si tiene calambres o dolor de cintura.  · Visite a su dentista si no lo ha hecho durante el embarazo. Use un cepillo de cerdas suaves para cepillarse los dientes. Pásese el hilo dental con suavidad.  · Puede seguir manteniendo relaciones sexuales, a menos que el médico le indique lo contrario.  · No haga viajes de larga distancia, excepto si es obligatorio y solamente con la aprobación del médico.  · Tome clases prenatales.  · Practique ir manejando al hospital.  · Prepare el bolso que llevará al hospital.  · Prepare la habitación del bebé.  · Concurra a los controles médicos.  SOLICITE AYUDA SI:  · No está segura de si está en trabajo de parto o si ha roto la bolsa de las aguas.  · Tiene mareos.  · Siente calambres leves o presión en la parte inferior del abdomen.  · Sufre un dolor persistente en el abdomen.  · Tiene malestar estomacal (náuseas), vómitos, o tiene deposiciones acuosas (diarrea).  · Advierte un olor fétido que proviene de la vagina.  · Siente dolor al orinar.  SOLICITE AYUDA DE INMEDIATO SI:   ·   Tiene fiebre.  · Tiene una pérdida de líquido por la vagina.  · Tiene sangrado o pequeñas pérdidas vaginales.  · Siente dolor intenso o cólicos en el abdomen.  · Sube o baja de peso rápidamente.  · Tiene dificultades para recuperar el aliento y siente dolor en el pecho.  · Súbitamente se le hinchan mucho el rostro, las manos, los tobillos, los pies o las piernas.  · No ha sentido los movimientos del bebé durante una hora.  · Siente un dolor de cabeza intenso que no se alivia con medicamentos.  · Su visión se modifica.     Esta información no tiene como fin reemplazar el  consejo del médico. Asegúrese de hacerle al médico cualquier pregunta que tenga.     Document Released: 03/15/2013 Document Revised: 08/03/2014  Elsevier Interactive Patient Education ©2016 Elsevier Inc.

## 2015-10-22 ENCOUNTER — Encounter: Payer: Self-pay | Admitting: Family Medicine

## 2015-10-22 DIAGNOSIS — Z3483 Encounter for supervision of other normal pregnancy, third trimester: Secondary | ICD-10-CM

## 2015-10-22 LAB — URINE CYTOLOGY ANCILLARY ONLY
CHLAMYDIA, DNA PROBE: NEGATIVE
NEISSERIA GONORRHEA: NEGATIVE

## 2015-10-28 ENCOUNTER — Encounter: Payer: Self-pay | Admitting: Family Medicine

## 2015-10-31 ENCOUNTER — Ambulatory Visit (INDEPENDENT_AMBULATORY_CARE_PROVIDER_SITE_OTHER): Payer: Self-pay | Admitting: Family Medicine

## 2015-10-31 VITALS — BP 112/77 | HR 78 | Temp 97.5°F | Wt 173.7 lb

## 2015-10-31 DIAGNOSIS — R062 Wheezing: Secondary | ICD-10-CM

## 2015-10-31 MED ORDER — ALBUTEROL SULFATE HFA 108 (90 BASE) MCG/ACT IN AERS: 2.0000 | INHALATION_SPRAY | Freq: Four times a day (QID) | RESPIRATORY_TRACT | Status: DC | PRN

## 2015-10-31 MED FILL — !VENTOLIN HFA INHALER: 108 (90 BAS | 27 days supply | Qty: 18 | Fill #0

## 2015-10-31 NOTE — Progress Notes (Signed)
    Subjective   Karla Reynolds is a  35 y.o. G3P2 at 1272w2d female that presents for a same day visit  1. Coughing: Symptoms started six days ago. Patient symptoms include non-productive coughing, rhinorrhea, wheezing and chest pain associated with her coughing. She reports some dyspnea when lying down although no orthopnea. She reports a childhood history of asthma and was treated with albuterol. Symptoms are worsening. She reports no associated leg swelling. No associated fevers, sneezing, nausea or vomiting. Sick contacts include her son, although he was sick afterwards. No vaginal discharge, bleeding of LOF. No contractions.  ROS Per HPI  Social History  Substance Use Topics  . Smoking status: Never Smoker   . Smokeless tobacco: Not on file  . Alcohol Use: No    Allergies  Allergen Reactions  . Penicillins Other (See Comments)    Body goes to sleep    Objective   BP 112/77 mmHg  Pulse 78  Temp(Src) 97.5 F (36.4 C) (Oral)  Wt 173 lb 11.2 oz (78.79 kg)  SpO2 98%  LMP 01/29/2015 (Exact Date)  General: Well appearing, no distress HEENT:   Head:  Normocephalic  Eyes: Pupils equal and reactive to light/accomodation. Extraocular movements intact bilaterally.  Ears: Tympanic membranes normal bilaterally.  Nose/Throat: Nares patent bilaterally with some dried blood in anterior left nare. Oropharnx clear and moist.  Neck: No cervical adenopathy bilaterally Respiratory/Chest: Wheezing of left lung on auscultation. Unlabored work of breathing. No rales. Reproducible chest pain bilaterally Cardiovascular: Regular rate and rhythm. Normal S1 and S2. No heart murmurs present. No extra heart sounds  Assessment and Plan   1. Wheezing Wheezing on exam. History of asthma in the past as a child. She has used albuterol before. Do not suspect pneumonia, although return precautions discussed.  - albuterol (PROVENTIL HFA;VENTOLIN HFA) 108 (90 Base) MCG/ACT inhaler; Inhale 2 puffs  into the lungs every 6 (six) hours as needed for wheezing or shortness of breath.  Dispense: 1 Inhaler; Refill: 0

## 2015-10-31 NOTE — Patient Instructions (Signed)
Thank you for coming to see me today. It was a pleasure. Today we talked about:   Wheezing/cough: I will prescribe an inhaler. Use this every 6 hours as needed for coughing  Please make an appointment for tomorrow for prenatal care.  If you have any questions or concerns, please do not hesitate to call the office at [phone].  Sincerely,  Jacquelin Hawkingalph Nettey, MD

## 2015-11-01 ENCOUNTER — Ambulatory Visit (INDEPENDENT_AMBULATORY_CARE_PROVIDER_SITE_OTHER): Payer: Self-pay | Admitting: Family Medicine

## 2015-11-01 ENCOUNTER — Encounter: Payer: Self-pay | Admitting: Family Medicine

## 2015-11-01 VITALS — BP 110/70 | HR 68 | Temp 97.7°F | Ht 61.0 in | Wt 175.0 lb

## 2015-11-01 DIAGNOSIS — Z3483 Encounter for supervision of other normal pregnancy, third trimester: Secondary | ICD-10-CM

## 2015-11-01 DIAGNOSIS — R05 Cough: Secondary | ICD-10-CM

## 2015-11-01 DIAGNOSIS — R059 Cough, unspecified: Secondary | ICD-10-CM

## 2015-11-01 MED ORDER — AZITHROMYCIN 250 MG PO TABS: ORAL_TABLET | ORAL | Status: DC

## 2015-11-01 MED FILL — ?AZITHROMYCIN 250 MG TABLET: 250 MG | 1 days supply | Qty: 6 | Fill #0

## 2015-11-01 NOTE — Progress Notes (Signed)
Date of Visit: 11/01/2015   HPI:  Karla Reynolds is a 35 y.o. G3P2002 at [redacted]w[redacted]d (via LMP = [redacted]w[redacted]d ultrasound) who had visit scheduled as an acute same day visit to discuss asthma, but patient is actually here for a prenatal care visit. Spanish interpreter utilized during today's visit.   She was seen yesterday here at the [facility] by Dr. Caleb Popp for asthma. Noted at that visit to be wheezing. Had albuterol inhaler prescribed but she wasn't able to pick it up yet as it wasn't ready when she went to the pharmacy. Patient reports that she's been sick for 7 days. Initially began with runny nose and fever, but has progressed to being a cough. Feels shortness of breath. Wheezing some. Has chest pain when she coughs, otherwise no pain. Endorses some swelling in both feet. Denies hemoptysis or fever now. No sore throat but has been drinking lemon tea with honey. Cough and shortness of breath are getting worse, not better. Cough is productive of green sputum. Has history of asthma as a child, and reports this feels the same. Has been taking some over the counter cold/cough medicine - doesn't know the exact name of what she's taken. Also has taken tylenol for fever.  Takes daily PNV. Prior two pregnancies were vaginal deliveries born at full term. Denies regularly occuring cramping/ctx, fluid leaking, vaginal bleeding, or decreased fetal movement.   This pregnancy has thus far been complicated by e coli UTI, treated with negative TOC. Initially had A1 renal pyelectasis seen on ultrasound, resolved on subsequent ultrasound.  ROS: See HPI  PHYSICAL EXAM: BP 110/70 mmHg  Pulse 68  Temp(Src) 97.7 F (36.5 C) (Oral)  Ht  (1.549 m)  Wt 175 lb (79.379 kg)  BMI 33.08 kg/m2  SpO2 98%  LMP 01/29/2015 (Exact Date)  FHR 143bpm Gen: NAD, pleasant, cooperative, well appearing HEENT: normocephalic, atraumatic, moist mucous membranes, tympanic membranes clear bilaterally. Some bulging on R  tympanic membrane but no erythema, only clear fluid behind ear. Nares with some drainage. Oropharynx clear and moist. No anterior cervical or supraclavicular lymphadenopathy.  Heart: regular rate and rhythm, no murmur Lungs: clear to auscultation bilaterally, normal work of breathing, no wheezes or crackles heard, occasional cough Abdomen: gravid, soft, nontender to palpation, leopolds suggest vertex presentation, fundus 40cm Neuro: alert, grossly nonfocal, speech normal Extremities: no lower extremity edema appreciated bilaterally. No warmth or calf tenderness.  ASSESSMENT/PLAN:  1. Cough and shortness of breath - consistent with history of asthma, likely triggered by initial viral URI. No wheezing or focal consolidation on exam today. Doubt PE given normal vital signs, lack of signs of DVT on exam, and no respiratory distress. Encouraged her to pick up albuterol. Since she is not wheezing today, will hold on systemic corticosteroids. After careful consideration, as patient has been sick for 7 days, feels as though she is worsening, and is producing green sputum, will proceed to treat for atypical pneumonia with azithromycin, which is pregnancy category B. Reviewed with patient that we typically avoid antibiotics in pregnancy if possible. Will cautiously treat, rather than potentially allowing this to progress to the point of florid pneumonia right as she is preparing to deliver her child. Defer CXR today as will likely not change management and want to avoid radiation exposure. Follow up Tues or Wed of next week, sooner if worsening.  2. Supervision of pregnancy - G3P2002 at [redacted]w[redacted]d, doing well. First post dates testing appointment set up for 4/12 at gestational age [redacted]w[redacted]d (NST  and AFI). [facility] but [facility] were not scheduling that far [facility]. Continue prenatal vitamins. GBS & gc/chl reviewed, all negative. Follow up Tuesday or Wednesday of next week to ensure  improvement in respiratory symptoms & continued prenatal care. Will need to schedule induction at that visit.   FOLLOW UP: Follow up in 5 days for prenatal care, cough & shortness of breath.   GrenadaBrittany J. Pollie MeyerMcIntyre, MD [facility]

## 2015-11-01 NOTE — Patient Instructions (Signed)
Take azithromycin 2 pills today, one pill per day starting tomorrow, total of 5 days If you have any contractions which occur 7-10 minutes apart, vaginal bleeding, fluid leaking, or are worried that baby is not moving well, go immediately to [facility] to be evaluated.   If breathing worsens at all this weekend go to [facility] [address] [place], [place] [address]  Pick up albuterol inhaler as well.  Follow up here Tuesday or Wednesday of next week for your next prenatal visit  Be well, Karla Reynolds   Tercer trimestre de Karla Reynolds (Third Trimester of Pregnancy) El tercer trimestre comprende desde la semana29 hasta la semana42, es decir, desde el mes7 hasta el mes9. El tercer trimestre es un perodo en el que el feto crece rpidamente. Hacia el final del noveno mes, el feto mide alrededor de 20pulgadas (45cm) de largo y pesa entre 6 y 10 libras (2,700 y 75,500kg).  CAMBIOS EN EL ORGANISMO Su organismo atraviesa por muchos cambios durante el [place], y estos varan de Karla Reynolds mujer a Karla Reynolds.   Seguir [employer]. Es de esperar que aumente entre 25 y 35libras (11 y 16kg) hacia el final del Karla Reynolds.  Podrn aparecer las primeras Albertson's caderas, el abdomen y las [place].  Puede tener necesidad de Geographical information systems officer con ms frecuencia porque el feto baja hacia la pelvis y ejerce presin sobre la vejiga.  Debido al Karla Reynolds podr sentir Karla Reynolds estomacal con frecuencia.  Puede estar estreida, ya que ciertas hormonas enlentecen los movimientos de los msculos que Karla Reynolds desechos a travs de los intestinos.  Pueden aparecer hemorroides o abultarse e hincharse las venas (venas varicosas).  Puede sentir dolor plvico debido al [employer] y a que las hormonas del Management consultant las articulaciones entre los huesos de la pelvis. El dolor de espalda puede ser consecuencia de la sobrecarga de los msculos que soportan la [place].  Tal vez haya cambios en el cabello  que pueden incluir su engrosamiento, crecimiento rpido y cambios en la textura. Adems, a algunas mujeres se les cae el cabello durante o despus del embarazo, o tienen el cabello seco o fino. Lo ms probable es que el cabello se le normalice despus del nacimiento del beb.  Las [employer]ra Foods seguirn creciendo y Development worker, community. A veces, puede haber una secrecin amarilla de las mamas llamada calostro.  El ombligo puede salir hacia afuera.  Puede sentir que le falta el aire debido a que se expande el tero.  Puede notar que el feto "baja" o lo siente ms bajo, en el abdomen.  Puede tener una prdida de secrecin mucosa con sangre. Esto suele ocurrir en el trmino de unos [address] a una semana antes de que comience el [place].  El cuello del tero se vuelve delgado y blando (se borra) cerca de la fecha de [place]. QU DEBE ESPERAR EN LOS EXMENES PRENATALES  Le harn exmenes prenatales cada 2semanas hasta la semana36. A partir de ese momento le harn exmenes semanales. Durante una visita prenatal de rutina:  La pesarn para asegurarse de que usted y el feto estn creciendo normalmente.  Le tomarn la presin arterial.  Le medirn el abdomen para controlar el desarrollo del beb.  Se escucharn los latidos cardacos fetales.  Se evaluarn los resultados de los estudios solicitados en visitas anteriores.  Le revisarn el cuello del tero cuando est prxima la fecha de parto para controlar si este se ha borrado. Alrededor de la semana36, el mdico  le revisar el cuello del tero. Al mismo tiempo, realizar un anlisis de las secreciones del tejido vaginal. Este examen es para determinar si hay un tipo de bacteria, estreptococo Grupo B. El mdico le explicar esto con ms detalle. El mdico puede preguntarle lo siguiente:  Cmo le gustara que fuera el [place].  Cmo se siente.  Si siente los movimientos del beb.  Si ha tenido sntomas anormales, como prdida de lquido, [place],  dolores de cabeza intensos o clicos abdominales.  Si est consumiendo algn producto que contenga tabaco, como cigarrillos, tabaco de Theatre manager y Administrator, Civil Service.  Si tiene [employer]. Otros exmenes o estudios de deteccin que pueden realizarse durante el tercer trimestre incluyen lo siguiente:  Anlisis de sangre para controlar los niveles de hierro (anemia).  Controles fetales para determinar su salud, nivel de [place] y Designer, jewellery. Si tiene [place] enfermedad o hay problemas durante el embarazo, le harn estudios.  Prueba del VIH (virus de inmunodeficiencia humana). Si corre Chiropodist, pueden realizarle una prueba de deteccin del VIH durante el tercer trimestre del embarazo. FALSO TRABAJO DE PARTO Es posible que sienta contracciones leves e irregulares que finalmente desaparecen. Se llaman contracciones de [address] o falso trabajo de [place]. Las [employer] pueden durar horas, [address] o incluso semanas, antes de que el verdadero trabajo de parto se inicie. Si las contracciones ocurren a intervalos regulares, se intensifican o se hacen dolorosas, lo mejor es que la revise el mdico.  SIGNOS DE TRABAJO DE PARTO   Clicos de tipo menstrual.  Contracciones cada o menos.  Contracciones que comienzan en la parte superior del tero y se extienden hacia abajo, a la zona inferior del abdomen y la espalda.  Sensacin de mayor presin en la pelvis o dolor de espalda.  Una secrecin de mucosidad acuosa o con sangre que sale de la vagina. Si tiene alguno de estos signos antes de la semana37 del Karla Reynolds, llame a su mdico de inmediato. Debe concurrir al hospital para que la controlen inmediatamente. INSTRUCCIONES PARA EL CUIDADO EN EL HOGAR   Evite fumar, consumir hierbas, beber alcohol y tomar frmacos que no le hayan recetado. Estas sustancias qumicas afectan la formacin y el desarrollo del beb.  No consuma ningn producto que contenga tabaco, lo que  incluye cigarrillos, tabaco de Theatre manager y Administrator, Civil Service. Si necesita ayuda para dejar de fumar, consulte al American Express. Puede recibir asesoramiento y otro tipo de recursos para dejar de fumar.  Siga las indicaciones del mdico en relacin con el uso de medicamentos. Durante el embarazo, hay medicamentos que son seguros de tomar y otros que no.  Haga ejercicio solamente como se lo haya indicado el mdico. Sentir clicos uterinos es un buen signo para Restaurant manager, fast food actividad fsica.  Contine comiendo alimentos sanos con regularidad.  Use un sostn que le brinde buen soporte si le Altria Group.  No se d baos de inmersin en agua caliente, baos turcos ni saunas.  Use el cinturn de seguridad en todo momento mientras conduce.  No coma carne cruda ni queso sin cocinar; evite el contacto con las bandejas sanitarias de los gatos y la tierra que estos animales usan. Estos elementos contienen grmenes que pueden causar defectos congnitos en el beb.  Tome las vitaminas prenatales.  Tome entre 1500 y  de calcio diariamente comenzando en la semana20 del embarazo [place].  Si est estreida, pruebe un laxante suave (si el mdico lo autoriza). Consuma ms alimentos ricos en fibra, como vegetales y  frutas frescos y Radiation protection practitionercereales integrales. Beba gran cantidad de lquido para mantener la orina de tono claro o color amarillo plido.  Dese baos de asiento con agua tibia para Engineer, materialsaliviar el dolor o las molestias causadas por las hemorroides. Use una crema para las hemorroides si el mdico la autoriza.  Si tiene venas varicosas, use medias de descanso. Eleve los pies durante 15minutos, 3 o 4veces por da. Limite el consumo de sal en su dieta.  Evite levantar objetos pesados, use zapatos de tacones bajos y [place]mantenga una buena postura.  Descanse con las piernas elevadas si tiene calambres o dolor de cintura.  Visite a su dentista si no lo ha Occupational hygienisthecho durante el embarazo. Use un cepillo de  dientes blando para higienizarse los dientes y psese el hilo dental con suavidad.  Puede seguir Calpine Corporationmanteniendo relaciones sexuales, a menos que el mdico le indique lo contrario.  No haga viajes largos excepto que sea absolutamente necesario y solo con la autorizacin del mdico.  Tome clases prenatales para Financial traderentender, Education administratorpracticar y hacer preguntas sobre el Scofieldtrabajo de parto y [place]el parto.  Haga un ensayo de la partida al hospital.  Prepare el bolso que llevar al hospital.  Prepare la habitacin del beb.  Concurra a todas las visitas prenatales segn las indicaciones de su mdico. SOLICITE ATENCIN MDICA SI:  No est segura de que est en trabajo de parto o de que ha roto la bolsa de las aguas.  Tiene mareos.  Siente clicos leves, presin en la pelvis o dolor persistente en el abdomen.  Tiene nuseas, vmitos o diarrea persistentes.  Brett Fairybserva una secrecin vaginal con mal olor.  Siente dolor al ConocoPhillipsorinar. SOLICITE ATENCIN MDICA DE INMEDIATO SI:   Tiene fiebre.  Tiene una prdida de lquido por la vagina.  Tiene sangrado o pequeas prdidas vaginales.  Siente dolor intenso o clicos en el abdomen.  Sube o baja de peso rpidamente.  Tiene dificultad para respirar y siente dolor de pecho.  Sbitamente se le hinchan mucho el rostro, las [place]manos, los tobillos, los pies o las piernas.  No ha sentido los movimientos del beb durante Georgianne Fickuna hora.  Siente un dolor de cabeza intenso que no se alivia con medicamentos.  Su visin se modifica.   Esta informacin no tiene Theme park managercomo fin reemplazar el consejo del mdico. Asegrese de hacerle al mdico cualquier pregunta que tenga.   Document Released: 04/22/2005 Document Revised: 08/03/2014 Elsevier Interactive Patient Education [employer]2016 Elsevier Inc.

## 2015-11-04 ENCOUNTER — Inpatient Hospital Stay (HOSPITAL_COMMUNITY)
Admission: AD | Admit: 2015-11-04 | Discharge: 2015-11-05 | DRG: 775 | Disposition: A | Discharge status: Home / Self Care | Payer: Medicaid Other | Source: Ambulatory Visit | Attending: Obstetrics & Gynecology | Admitting: Obstetrics & Gynecology

## 2015-11-04 ENCOUNTER — Encounter (HOSPITAL_COMMUNITY): Payer: Self-pay | Admitting: *Deleted

## 2015-11-04 DIAGNOSIS — Z880 Allergy status to penicillin: Secondary | ICD-10-CM | POA: Diagnosis not present

## 2015-11-04 DIAGNOSIS — Z3A39 39 weeks gestation of pregnancy: Secondary | ICD-10-CM

## 2015-11-04 DIAGNOSIS — Z79899 Other long term (current) drug therapy: Secondary | ICD-10-CM

## 2015-11-04 HISTORY — DX: Unspecified asthma, uncomplicated: J45.909

## 2015-11-04 LAB — CBC
HEMATOCRIT: 35.6 % — AB (ref 36.0–46.0)
Hemoglobin: 12.4 g/dL (ref 12.0–15.0)
MCH: 28.6 pg (ref 26.0–34.0)
MCHC: 34.8 g/dL (ref 30.0–36.0)
MCV: 82.2 fL (ref 78.0–100.0)
PLATELETS: 182 10*3/uL (ref 150–400)
RBC: 4.33 MIL/uL (ref 3.87–5.11)
RDW: 13.3 % (ref 11.5–15.5)
WBC: 13.8 10*3/uL — AB (ref 4.0–10.5)

## 2015-11-04 MED ORDER — DIPHENHYDRAMINE HCL 25 MG PO CAPS: 25.0000 mg | ORAL_CAPSULE | Freq: Four times a day (QID) | ORAL | Status: DC | PRN

## 2015-11-04 MED ORDER — KETOROLAC TROMETHAMINE 60 MG/2ML IM SOLN
60.0000 mg | Freq: Once | INTRAMUSCULAR | Status: AC
  Administered 2015-11-04: 60 mg via INTRAMUSCULAR
  Filled 2015-11-04: qty 2

## 2015-11-04 MED ORDER — TETANUS-DIPHTH-ACELL PERTUSSIS 5-2.5-18.5 LF-MCG/0.5 IM SUSP: 0.5000 mL | Freq: Once | INTRAMUSCULAR | Status: DC

## 2015-11-04 MED ORDER — ONDANSETRON HCL 4 MG/2ML IJ SOLN: 4.0000 mg | INTRAMUSCULAR | Status: DC | PRN

## 2015-11-04 MED ORDER — ZOLPIDEM TARTRATE 5 MG PO TABS: 5.0000 mg | ORAL_TABLET | Freq: Every evening | ORAL | Status: DC | PRN

## 2015-11-04 MED ORDER — LANOLIN HYDROUS EX OINT
TOPICAL_OINTMENT | CUTANEOUS | Status: DC | PRN
Start: 2015-11-04 — End: 2015-11-05

## 2015-11-04 MED ORDER — SIMETHICONE 80 MG PO CHEW: 80.0000 mg | CHEWABLE_TABLET | ORAL | Status: DC | PRN

## 2015-11-04 MED ORDER — BENZOCAINE-MENTHOL 20-0.5 % EX AERO
1.0000 | INHALATION_SPRAY | CUTANEOUS | Status: DC | PRN
Start: 2015-11-04 — End: 2015-11-05
  Administered 2015-11-04: 1 via TOPICAL
  Filled 2015-11-04: qty 56

## 2015-11-04 MED ORDER — PRENATAL MULTIVITAMIN CH
1.0000 | ORAL_TABLET | Freq: Every day | ORAL | Status: DC
  Administered 2015-11-04 – 2015-11-05 (×2): 1 via ORAL
  Filled 2015-11-04 (×2): qty 1

## 2015-11-04 MED ORDER — ONDANSETRON HCL 4 MG PO TABS: 4.0000 mg | ORAL_TABLET | ORAL | Status: DC | PRN

## 2015-11-04 MED ORDER — OXYCODONE-ACETAMINOPHEN 5-325 MG PO TABS
1.0000 | ORAL_TABLET | ORAL | Status: DC | PRN
  Administered 2015-11-04 (×2): 1 via ORAL
  Filled 2015-11-04 (×2): qty 1

## 2015-11-04 MED ORDER — DIBUCAINE 1 % RE OINT: 1.0000 "application " | TOPICAL_OINTMENT | RECTAL | Status: DC | PRN

## 2015-11-04 MED ORDER — IBUPROFEN 600 MG PO TABS
600.0000 mg | ORAL_TABLET | Freq: Four times a day (QID) | ORAL | Status: DC
  Administered 2015-11-04 – 2015-11-05 (×5): 600 mg via ORAL
  Filled 2015-11-04 (×5): qty 1

## 2015-11-04 MED ORDER — OXYCODONE-ACETAMINOPHEN 5-325 MG PO TABS: 2.0000 | ORAL_TABLET | ORAL | Status: DC | PRN

## 2015-11-04 MED ORDER — WITCH HAZEL-GLYCERIN EX PADS: 1.0000 "application " | MEDICATED_PAD | CUTANEOUS | Status: DC | PRN

## 2015-11-04 MED ORDER — ALBUTEROL SULFATE (2.5 MG/3ML) 0.083% IN NEBU: 3.0000 mL | INHALATION_SOLUTION | Freq: Four times a day (QID) | RESPIRATORY_TRACT | Status: DC | PRN

## 2015-11-04 MED ORDER — ACETAMINOPHEN 325 MG PO TABS
650.0000 mg | ORAL_TABLET | ORAL | Status: DC | PRN
  Filled 2015-11-04: qty 2

## 2015-11-04 MED ORDER — SENNOSIDES-DOCUSATE SODIUM 8.6-50 MG PO TABS
2.0000 | ORAL_TABLET | ORAL | Status: DC
  Administered 2015-11-04: 2 via ORAL
  Filled 2015-11-04: qty 2

## 2015-11-04 NOTE — Progress Notes (Signed)
UR chart review completed.  

## 2015-11-04 NOTE — Lactation Note (Signed)
This note was copied from a baby's chart. Lactation Consultation Note Experienced BF mom BF her 1st child for 1 yr and her 2nd child for 3 yrs. Who is now 89 yrs old. Mom has great everted nipples w/good colostrum.  Mom just finished BF in side lying position in bed. Baby has a bruised face and head. Discussed possibility of risk for jaundice. Interpreter present for consult and information. Mom encouraged to feed baby 8-12 times/24 hours and with feeding cues. Referred to Baby and Me Book in Breastfeeding section Pg. 22-23 for position options and Proper latch demonstration. Educated about newborn behavior, STS,and I&O. WH/LC brochure given w/resources, support groups and LC services. Patient Name: Karla Reynolds Today's Date: 11/04/2015 Reason for consult: Initial assessment   Maternal Data Has patient been taught Hand Expression?: Yes Does the patient have breastfeeding experience prior to this delivery?: Yes  Feeding Feeding Type: Breast Fed  LATCH Score/Interventions Latch: Grasps breast easily, tongue down, lips flanged, rhythmical sucking.  Audible Swallowing: A few with stimulation Intervention(s): Skin to skin  Type of Nipple: Everted at rest and after stimulation  Comfort (Breast/Nipple): Soft / non-tender     Hold (Positioning): No assistance needed to correctly position infant at breast. Intervention(s): Breastfeeding basics reviewed;Support Pillows;Position options;Skin to skin  LATCH Score: 8  Lactation Tools Discussed/Used WIC Program: Yes   Consult Status Consult Status: PRN Date: 11/04/15 Follow-up type: In-patient    Ryer Asato, Diamond NickelLAURA G 11/04/2015, 4:02 AM

## 2015-11-04 NOTE — Progress Notes (Signed)
Patient only wanted broth and juice for lunch. Eda H Royal Interpreter.

## 2015-11-04 NOTE — Progress Notes (Signed)
I stopped by to check on patients need, I ordered snack, dinner, by Orlan LeavensViria Alvarez Spanish Interpreter.

## 2015-11-04 NOTE — H&P (Signed)
LABOR ADMISSION HISTORY AND PHYSICAL  Karla Reynolds is a 35 y.o. female G3P3001 with IUP at [redacted]w[redacted]d by 12 wk u/s presenting for s/p SVD.  Patient reports that her water broke around 130am.  She delivered while on route to [facility], in the car.  Per RN report, upon arrival, patient and baby were doing well.  Child was vigorous.  Please see CNM delivery note for details.  She plans on breast feeding. She request IUD for birth control.  Plans on bringing child to Dr Alfredo [facility]) for pediatric care after discharge.  Dating: By 12 week u/s --->  Estimated Date of Delivery: 11/05/15  Prenatal History/Complications:  [facility] Prenatal Labs  Dating 12 week [place] Blood type: A/POS/-- (09/16 1401)   Genetic Screen 1 Screen:    AFP:     Quad:     NIPS: Antibody:NEG (09/16 1401)  Anatomic [place] Mild left hydronephrosis on first scan, Resolved on follow up scan Rubella: >33.00 (09/16 1401)  GTT Early:  N/A             Third trimester: 1hr 71 RPR: NON REAC (01/19 1026)   Flu vaccine  Received at husband's work in November, per patient. HBsAg: NEGATIVE (09/16 1401)   TDaP vaccine     09/12/2015                                      Rhogam: N/A HIV: NONREACTIVE (01/19 1026)   GBS                                              (For PCN allergy, check sensitivities) GBS: Neg  Contraception  IUD Pap: Normal in 2015  Baby Food  Breast and Bottle   Circumcision  N/A (girl)   Pediatrician  [facility]   Support Person     Past Medical History: No past medical history on file.  Past Surgical History: No past surgical history on file.  Obstetrical History: OB History    Gravida Para Term Preterm AB TAB SAB Ectopic Multiple Living   0 0    0 1      Social History: Social History   Social History  . Marital Status: Single    Spouse Name: N/A  . Number of Children: N/A  . Years of Education: N/A   Social History Main Topics  . Smoking status: Never Smoker   .  Smokeless tobacco: Not on file  . Alcohol Use: No  . Drug Use: No  . Sexual Activity: Not on file   Other Topics Concern  . Not on file   Social History Narrative    Family History: No family history on file.  Allergies: Allergies  Allergen Reactions  . Penicillins Other (See Comments)    Body goes to sleep    Prescriptions prior to admission  Medication Sig Dispense Refill Last Dose  . acetaminophen (TYLENOL) 500 MG tablet Take 1 tablet (500 mg total) by mouth every 6 (six) hours as needed. 30 tablet 0   . albuterol (PROVENTIL HFA;VENTOLIN HFA) 108 (90 Base) MCG/ACT inhaler Inhale 2 puffs into the lungs every 6 (six) hours as needed for wheezing or shortness of breath. 1 Inhaler 0   . azithromycin (ZITHROMAX)  250 MG tablet Take 2 tabs day 1, then 1 tab daily 6 each 0   . docusate sodium (COLACE) 100 MG capsule Take 1 capsule (100 mg total) by mouth 2 (two) times daily. 10 capsule 0   . hydrOXYzine (ATARAX/VISTARIL) 25 MG tablet Take 1 tablet (25 mg total) by mouth 3 (three) times daily as needed for itching. 15 tablet 0   . loratadine (CLARITIN) 10 MG tablet Take 1 tablet (10 mg total) by mouth daily. 30 tablet 11   . magnesium hydroxide (MILK OF MAGNESIA) 400 MG/5ML suspension Take 30 mLs by mouth at bedtime as needed for mild constipation. 360 mL 0   . omeprazole (PRILOSEC) 40 MG capsule Take 1 capsule (40 mg total) by mouth daily. 30 capsule 0 03/31/2015 at Unknown time  . ondansetron (ZOFRAN) 4 MG tablet Take 1 tablet (4 mg total) by mouth every 8 (eight) hours as needed for nausea or vomiting. 20 tablet 0 03/31/2015 at Unknown time     Review of Systems   All systems reviewed and negative except as stated in HPI  Last menstrual period 01/29/2015, unknown if currently breastfeeding. General appearance: alert, cooperative, appears stated age and no distress Lungs: clear to auscultation bilaterally, no increased WOB Heart: regular rate and rhythm, no m/r/g Abdomen: soft,  non-tender; bowel sounds normal Pelvic: first degree lac, hemostatic, did not require repair Extremities: WWP, Homans sign is negative, no sign of DVT, +2 DP Neuro: follows commands, no focal deficits  Prenatal labs: ABO, Rh: A/POS/-- (09/16 1401) Antibody: NEG (09/16 1401) Rubella: !Error!IMMUNE RPR: NON REAC (01/19 1026)  HBsAg: NEGATIVE (09/16 1401)  HIV: NONREACTIVE (01/19 1026)  GBS: Negative (03/21 0000)  1 hr Glucola normal Genetic screening  declined Anatomy US Mild left hydronephrosis on first scan, Resolved on follow up scan  Prenatal Transfer Tool  Maternal Diabetes: No Genetic Screening: Declined Maternal Ultrasounds/Referrals: Normal Fetal Ultrasounds or other Referrals:  None Maternal Substance Abuse:  No Significant Maternal Medications:  None Significant Maternal Lab Results: Lab values include: Group B Strep negative  No results found for this or any previous visit (from the past 24 hour(s)).  Patient Active Problem List   Diagnosis Date Noted  . Encounter for supervision of other normal pregnancy in first trimester 08/15/2015  . Itching 08/15/2015  . Encounter for supervision of other normal pregnancy 04/19/2015  . Acute cystitis during pregnancy in first trimester 04/19/2015   Assessment: Karla Reynolds is a 35 y.o. G3P3001 at 1723w6d here after precipitous delivery in route to hospital.  Mother and child doing well.  Delivery of placenta performed by Norval MortonWalida Karim, CNM.  Please see her detailed note in EMR.  Mother and baby to PP. Girl/ Breast/ IUD   Omnicomshly Gottschalk, DO 11/04/2015, 2:19 AM

## 2015-11-04 NOTE — MAU Note (Signed)
PT  ARRIVED   VIA CAR -  SAYING  SHE  DEL IN CAR  ON WAY TO HOSPITAL   AT 0130  .  TO RM 6 VIA  W/C  - CALLED INTERPRETER -  CAROL

## 2015-11-04 NOTE — Progress Notes (Signed)
Interpreter Okey Regal(Carol) used for all M/B admission teaching.

## 2015-11-05 LAB — RPR: RPR: NONREACTIVE

## 2015-11-05 MED ORDER — IBUPROFEN 600 MG PO TABS: 600.0000 mg | ORAL_TABLET | Freq: Four times a day (QID) | ORAL | Status: DC

## 2015-11-05 MED FILL — IBUPROFEN 600 MG TABLET: 600 | 30 days supply | Qty: 30 | Fill #0

## 2015-11-05 NOTE — Discharge Instructions (Signed)
Parto vaginal, Cuidados posteriores  °(Vaginal Delivery, Care After) °Siga estas instrucciones durante las próximas semanas. Estas indicaciones para el alta le proporcionan información general acerca de cómo deberá cuidarse después del parto. El médico también podrá darle instrucciones específicas. El tratamiento ha sido planificado según las prácticas médicas actuales, pero en algunos casos pueden ocurrir problemas. Comuníquese con el médico si tiene algún problema o tiene preguntas al volver a su casa.  °INSTRUCCIONES PARA EL CUIDADO EN EL HOGAR  °· Tome sólo medicamentos de venta libre o recetados, según las indicaciones del médico o del farmacéutico. °· No beba alcohol, especialmente si está amamantando o toma analgésicos. °· No mastique tabaco ni fume. °· No consuma drogas. °· Continúe con un adecuado cuidado perineal. El buen cuidado perineal incluye: °¨ Higienizarse de adelante hacia atrás. °¨ Mantener la zona perineal limpia. °· No use tampones ni duchas vaginales hasta que su médico la autorice. °· Dúchese, lávese el cabello y tome baños de inmersión según las indicaciones de su médico. °· Utilice un sostén que le ajuste bien y que brinde buen soporte a sus mamas. °· Consuma alimentos saludables. °· Beba suficiente líquido para mantener la orina clara o de color amarillo pálido. °· Consuma alimentos ricos en fibra como cereales y panes integrales, arroz, frijoles y frutas y verduras frescas todos los días. Estos alimentos pueden ayudarla a prevenir o aliviar el estreñimiento. °· Siga las recomendaciones de su médico relacionadas con la reanudación de actividades como subir escaleras, conducir automóviles, levantar objetos, hacer ejercicios o viajar. °· Hable con su médico acerca de reanudar la actividad sexual. Volver a la actividad sexual depende del riesgo de infección, la velocidad de la curación y la comodidad y su deseo de reanudarla. °· Trate de que alguien la ayude con las actividades del hogar y con  el recién nacido al menos durante un par de días después de salir del hospital. °· Descanse todo lo que pueda. Trate de descansar o tomar una siesta mientras el bebé está durmiendo. °· Aumente sus actividades gradualmente. °· Cumpla con todas las visitas de control programadas para después del parto. Es muy importante asistir a todas las citas programadas de seguimiento. En estas citas, su médico va a controlarla para asegurarse de que esté sanando física y emocionalmente. °SOLICITE ATENCIÓN MÉDICA SI:  °· Elimina coágulos grandes por la vagina. Guarde algunos coágulos para mostrarle al médico. °· Tiene una secreción con feo olor que proviene de la vagina. °· Tiene dificultad para orinar. °· Orina con frecuencia. °· Siente dolor al orinar. °· Nota un cambio en sus movimientos intestinales. °· Aumenta el enrojecimiento, el dolor o la hinchazón en la zona de la incisión vaginal (episiotomía) o el desgarro vaginal. °· Tiene pus que drena por la episiotomía o el desgarro vaginal. °· La episiotomía o el desgarro vaginal se abren. °· Sus mamas le duelen, están duras o enrojecidas. °· Sufre un dolor intenso de cabeza. °· Tiene visión borrosa o ve manchas. °· Se siente triste o deprimida. °· Tiene pensamientos acerca de lastimarse o dañar al recién nacido. °· Tiene preguntas acerca de su cuidado personal, el cuidado del recién nacido o acerca de los medicamentos. °· Se siente mareada o sufre un desmayo. °· Tiene una erupción. °· Tiene náuseas o vómitos. °· Usted amamantó al bebé y no ha tenido su período menstrual dentro de las 12 semanas después de dejar de amamantar. °· No amamanta al bebé y no tuvo su período menstrual en las últimas 12° semanas después del   parto. °· Tiene fiebre. °SOLICITE ATENCIÓN MÉDICA DE INMEDIATO SI:  °· Siente dolor persistente. °· Siente dolor en el pecho. °· Le falta el aire. °· Se desmaya. °· Siente dolor en la pierna. °· Siente dolor en el estómago. °· El sangrado vaginal satura dos o más  apósitos en 1 hora. °  °Esta información no tiene como fin reemplazar el consejo del médico. Asegúrese de hacerle al médico cualquier pregunta que tenga. °  °Document Released: 07/13/2005 Document Revised: 04/03/2015 °Elsevier Interactive Patient Education ©2016 Elsevier Inc. ° °

## 2015-11-05 NOTE — Discharge Summary (Signed)
OB Discharge Summary  Patient Name: Karla Reynolds DOB: 01/21/1981 MRN: [id number]  Date of admission: 11/04/2015 Delivering MD: Marlis Edelson   Date of discharge: 11/05/2015  Admitting diagnosis: DELIVERED IN CAR  Intrauterine pregnancy: [redacted]w[redacted]d     Secondary diagnosis:Principal Problem:   SVD (spontaneous vaginal delivery)  Additional problems: Baby delivered in the car en route to the hospital. ROM occurred shortly after the birth of the baby. Baby was vigorous and crying on arrival to the MAU. Pt wheeled into the MAU and the placenta was delivered without difficulty.    Discharge diagnosis: Term Pregnancy Delivered                                                                     Post partum procedures:none  Augmentation: none  Complications: None  Hospital course:  Onset of Labor With Vaginal Delivery     35 y.o. yo G3P3003 at [redacted]w[redacted]d was admitted after vaginal delivery in her car in route to the hospital on 11/04/2015.  Membrane Rupture Time/Date: 1:25 AM ,11/04/2015   Intrapartum Procedures: Episiotomy: None [1]                                         Lacerations:  1st degree [2]  Patient had a delivery of a Viable infant. 11/04/2015  Information for the patient's newborn:  Hiral, Lukasiewicz Girl Marketta [[id number]]  Delivery Method: Vaginal, Spontaneous Delivery (Filed from Delivery Summary)    Pateint had an uncomplicated postpartum course.  She is ambulating, tolerating a regular diet, passing flatus, and urinating well. Patient is discharged home in stable condition on 11/05/2015.    Physical exam  Filed Vitals:   11/04/15 0430 11/04/15 0830 11/04/15 1715 11/05/15 0627  BP: 126/81 120/78 121/77 120/69  Pulse: 66 60 73 78  Temp: 98.2 F (36.8 C) 98.1 F (36.7 C) 98.3 F (36.8 C) 98.4 F (36.9 C)  TempSrc:  Oral Oral Oral  Resp: SpO2: 99%      General: alert, cooperative and no distress Lochia: appropriate Uterine Fundus:  firm Incision: N/A DVT Evaluation: No evidence of DVT seen on physical exam. Labs: Lab Results  Component Value Date   WBC 13.8* 11/04/2015   HGB 12.4 11/04/2015   HCT 35.6* 11/04/2015   MCV 82.2 11/04/2015   PLT 182 11/04/2015   CMP Latest Ref Rng 04/01/2015  Glucose 65 - 99 mg/dL 94  BUN 6 - 20 mg/dL 9  Creatinine 2.13 - 0.86 mg/dL 5.78  Sodium 469 - 629 mmol/L 134(L)  Potassium 3.5 - 5.1 mmol/L 3.4(L)  Chloride 101 - 111 mmol/L 103  CO2 22 - 32 mmol/L 22  Calcium 8.9 - 10.3 mg/dL 8.9  Total Protein 6.5 - 8.1 g/dL 6.7  Total Bilirubin 0.3 - 1.2 mg/dL 5.2(W)  Alkaline Phos 38 - 126 U/L 40  AST 15 - 41 U/L 16  ALT 14 - 54 U/L 13(L)    Discharge instruction: per After Visit Summary and "Baby and Me Booklet".  After Visit Meds:    Medication List    TAKE these medications  albuterol 108 (90 Base) MCG/ACT inhaler  Commonly known as:  PROVENTIL HFA;VENTOLIN HFA  Inhale 2 puffs into the lungs every 6 (six) hours as needed for wheezing or shortness of breath.     ibuprofen 600 MG tablet  Commonly known as:  ADVIL,MOTRIN  Take 1 tablet (600 mg total) by mouth every 6 (six) hours.        Diet: routine diet  Activity: Advance as tolerated. Pelvic rest for 6 weeks.   Outpatient follow up:6 weeks Follow up Appt:Future Appointments Date Time Provider Department Center  11/06/2015 8:30 AM Latrelle DodrillBrittany J McIntyre, MD [facility]   Follow up visit: No Follow-up on file.  Postpartum contraception: IUD Mirena  Newborn Data: Live born female  Birth Weight: 6 lb 12.3 oz (3070 g) APGAR: ,   Baby Feeding: Breast Disposition:home with mother   11/05/2015 Hilton SinclairKaty D Mayo, MD  CNM attestation:  I have seen and examined this patient; I agree with above documentation in the Resident's note.    Clemmons,Lori Grissett, CNM 10:04 AM

## 2015-11-05 NOTE — Progress Notes (Signed)
Post Partum Day 1 Subjective:  Karla Reynolds is a 35 y.o. [id number] 6578w6d s/p SVD in her car.  No acute events overnight.  Pt denies problems with ambulating, voiding or po intake.  She denies nausea or vomiting.  Pain is well controlled.  She has had flatus. She has not had bowel movement.  Lochia Small.  Plan for birth control is IUD.  Method of Feeding: Breast  Objective: Blood pressure 120/69, pulse 78, temperature 98.4 F (36.9 C), temperature source Oral, resp. rate 18, last menstrual period 01/29/2015, SpO2 99 %, unknown if currently breastfeeding.  Physical Exam:  General: alert, cooperative and no distress Lochia:normal flow Chest: CTAB, normal WOB on room air Heart: RRR no m/r/g Abdomen: +BS, soft, nontender Uterine Fundus: firm DVT Evaluation: No evidence of DVT seen on physical exam. Extremities: no edema   Recent Labs  11/04/15 0511  HGB 12.4  HCT 35.6*    Assessment/Plan:  ASSESSMENT: Karla Reynolds is a 35 y.o. [id number] 2378w6d s/p SVD  Plan for discharge tomorrow, Breastfeeding and Contraception IUD   LOS: 1 day   Delynn FlavinAshly Gottschalk, DO 11/05/2015, 6:47 AM   OB fellow attestation:  I have seen and examined this patient; I agree with above documentation in the resident's note.   Federico FlakeKimberly Niles Kodey Xue, MD 8:47 AM

## 2015-11-05 NOTE — Lactation Note (Signed)
This note was copied from a baby's chart. Lactation Consultation Note  Patient Name: Karla Reynolds Today's Date: 11/05/2015 Reason for consult: Follow-up assessment  Baby 33 hours old. Eda assisted with interpretation during consultation. Mom had questions about her supply. Mom reports that she had a good supply with other children but baby seems to be nursing a lot. Mom also reports that she gave baby formula a little earlier, and then baby spit up. Discussed cluster-feeding and normal progression of milk coming to volume. Enc mom to keep putting baby to breasts first with each feeding, and then supplementing as needed. Examined mom's breasts and they are easily expressible. Mom reports that both breasts are starting to feel heavier. Mom aware of OP/BFSG and LC phone line assistance after D/C.  Maternal Data    Feeding    LATCH Score/Interventions                      Lactation Tools Discussed/Used     Consult Status Consult Status: PRN    Geralynn OchsWILLIARD, [name] 11/05/2015, 10:41 AM

## 2015-11-05 NOTE — Progress Notes (Signed)
Patient discharged home with friend... Discharge instructions reviewed with patient, with Darien RamusAna, [employer], on phone... Patient verbalized understanding... Condition stable... No equipment... Ambulated to car with NT.

## 2015-11-05 NOTE — Discharge Summary (Signed)
OB Discharge Summary  Patient Name: Karla Reynolds DOB: Dec 17, 1980 MRN: [id number]  Date of admission: 11/04/2015 Delivering MD: Marlis Edelson   Date of discharge: 11/05/2015  Admitting diagnosis: DELIVERED IN CAR  Intrauterine pregnancy: [redacted]w[redacted]d     Secondary diagnosis:Principal Problem:   SVD (spontaneous vaginal delivery)  Additional problems: Baby delivered in the car en route to the hospital. ROM occurred shortly after the birth of the baby. Baby was vigorous and crying on arrival to the MAU. Pt wheeled into the MAU and the placenta was delivered without difficulty.    Discharge diagnosis: Term Pregnancy Delivered                                                                     Post partum procedures:none  Augmentation: none  Complications: None  Hospital course:  Onset of Labor With Vaginal Delivery     35 y.o. yo G3P3003 at [redacted]w[redacted]d was admitted after vaginal delivery in her car in route to the hospital on 11/04/2015.  Membrane Rupture Time/Date: 1:25 AM ,11/04/2015   Intrapartum Procedures: Episiotomy: None [1]                                         Lacerations:  1st degree [2]  Patient had a delivery of a Viable infant. 11/04/2015  Information for the patient's newborn:  Gael, Delude Girl Irina [[id number]]  Delivery Method: Vaginal, Spontaneous Delivery (Filed from Delivery Summary)     Pateint had an uncomplicated postpartum course.  She is ambulating, tolerating a regular diet, passing flatus, and urinating well. Patient is discharged home in stable condition on 11/05/2015.    Physical exam  Filed Vitals:   11/04/15 0430 11/04/15 0830 11/04/15 1715 11/05/15 0627  BP: 126/81 120/78 121/77 120/69  Pulse: 66 60 73 78  Temp: 98.2 F (36.8 C) 98.1 F (36.7 C) 98.3 F (36.8 C) 98.4 F (36.9 C)  TempSrc:  Oral Oral Oral  Resp: SpO2: 99%      General: alert, cooperative and no distress Lochia: appropriate Uterine Fundus:  firm Incision: N/A DVT Evaluation: No evidence of DVT seen on physical exam. Labs: Lab Results  Component Value Date   WBC 13.8* 11/04/2015   HGB 12.4 11/04/2015   HCT 35.6* 11/04/2015   MCV 82.2 11/04/2015   PLT 182 11/04/2015   CMP Latest Ref Rng 04/01/2015  Glucose 65 - 99 mg/dL 94  BUN 6 - 20 mg/dL 9  Creatinine 7.82 - 9.56 mg/dL 2.13  Sodium 086 - 578 mmol/L 134(L)  Potassium 3.5 - 5.1 mmol/L 3.4(L)  Chloride 101 - 111 mmol/L 103  CO2 22 - 32 mmol/L 22  Calcium 8.9 - 10.3 mg/dL 8.9  Total Protein 6.5 - 8.1 g/dL 6.7  Total Bilirubin 0.3 - 1.2 mg/dL 4.6(N)  Alkaline Phos 38 - 126 U/L 40  AST 15 - 41 U/L 16  ALT 14 - 54 U/L 13(L)    Discharge instruction: per After Visit Summary and "Baby and Me Booklet".  After Visit Meds:    Medication List    TAKE these medications  albuterol 108 (90 Base) MCG/ACT inhaler  Commonly known as:  PROVENTIL HFA;VENTOLIN HFA  Inhale 2 puffs into the lungs every 6 (six) hours as needed for wheezing or shortness of breath.     ibuprofen 600 MG tablet  Commonly known as:  ADVIL,MOTRIN  Take 1 tablet (600 mg total) by mouth every 6 (six) hours.        Diet: routine diet  Activity: Advance as tolerated. Pelvic rest for 6 weeks.   Outpatient follow up:6 weeks Follow up Appt: Future Appointments Date Time Provider Department Center  11/06/2015 8:30 AM Latrelle DodrillBrittany J McIntyre, MD [facility]   Follow up visit: No Follow-up on file.  Postpartum contraception: IUD Mirena  Newborn Data: Live born female  Birth Weight: 6 lb 12.3 oz (3070 g) APGAR: ,   Baby Feeding: Breast Disposition:home with mother   11/05/2015 Leeroy Chalemmons,Lori Grissett, CNM

## 2015-11-06 ENCOUNTER — Other Ambulatory Visit: Payer: Self-pay

## 2015-11-06 ENCOUNTER — Encounter: Payer: Self-pay | Admitting: Family Medicine

## 2016-01-22 ENCOUNTER — Ambulatory Visit: Payer: Self-pay | Admitting: Internal Medicine

## 2016-02-26 ENCOUNTER — Ambulatory Visit: Payer: Medicaid Other | Attending: Internal Medicine | Admitting: Internal Medicine

## 2016-02-26 ENCOUNTER — Encounter: Payer: Self-pay | Admitting: Internal Medicine

## 2016-02-26 VITALS — BP 115/79 | HR 74 | Temp 98.0°F | Resp 16 | Wt 181.0 lb

## 2016-02-26 DIAGNOSIS — R300 Dysuria: Secondary | ICD-10-CM | POA: Insufficient documentation

## 2016-02-26 DIAGNOSIS — Z309 Encounter for contraceptive management, unspecified: Secondary | ICD-10-CM

## 2016-02-26 DIAGNOSIS — Z0000 Encounter for general adult medical examination without abnormal findings: Secondary | ICD-10-CM

## 2016-02-26 DIAGNOSIS — Z131 Encounter for screening for diabetes mellitus: Secondary | ICD-10-CM

## 2016-02-26 DIAGNOSIS — N898 Other specified noninflammatory disorders of vagina: Secondary | ICD-10-CM | POA: Insufficient documentation

## 2016-02-26 DIAGNOSIS — Z3009 Encounter for other general counseling and advice on contraception: Secondary | ICD-10-CM

## 2016-02-26 LAB — POCT URINALYSIS DIPSTICK
Bilirubin, UA: NEGATIVE
Blood, UA: NEGATIVE
Glucose, UA: NEGATIVE
Ketones, UA: NEGATIVE
Leukocytes, UA: NEGATIVE
Nitrite, UA: NEGATIVE
PH UA: 6
PROTEIN UA: NEGATIVE
SPEC GRAV UA: 1.02
UROBILINOGEN UA: 0.2

## 2016-02-26 LAB — CBC WITH DIFFERENTIAL/PLATELET
BASOS ABS: 0 {cells}/uL (ref 0–200)
Basophils Relative: 0 %
EOS ABS: 72 {cells}/uL (ref 15–500)
Eosinophils Relative: 1 %
HEMATOCRIT: 38.6 % (ref 35.0–45.0)
HEMOGLOBIN: 13 g/dL (ref 11.7–15.5)
LYMPHS ABS: 2304 {cells}/uL (ref 850–3900)
Lymphocytes Relative: 32 %
MCH: 28.8 pg (ref 27.0–33.0)
MCHC: 33.7 g/dL (ref 32.0–36.0)
MCV: 85.4 fL (ref 80.0–100.0)
MONO ABS: 648 {cells}/uL (ref 200–950)
MPV: 12.5 fL (ref 7.5–12.5)
Monocytes Relative: 9 %
NEUTROS PCT: 58 %
Neutro Abs: 4176 cells/uL (ref 1500–7800)
Platelets: 229 10*3/uL (ref 140–400)
RBC: 4.52 MIL/uL (ref 3.80–5.10)
RDW: 13.3 % (ref 11.0–15.0)
WBC: 7.2 10*3/uL (ref 3.8–10.8)

## 2016-02-26 LAB — BASIC METABOLIC PANEL WITH GFR
BUN: 18 mg/dL (ref 7–25)
CHLORIDE: 106 mmol/L (ref 98–110)
CO2: 23 mmol/L (ref 20–31)
CREATININE: 0.6 mg/dL (ref 0.50–1.10)
Calcium: 8.9 mg/dL (ref 8.6–10.2)
GFR, Est African American: >89 mL/min (ref >=60)
GFR, Est Non African American: >89 mL/min (ref >=60)
GLUCOSE: 82 mg/dL (ref 65–99)
POTASSIUM: 4.2 mmol/L (ref 3.5–5.3)
Sodium: 139 mmol/L (ref 135–146)

## 2016-02-26 LAB — POCT GLYCOSYLATED HEMOGLOBIN (HGB A1C): HEMOGLOBIN A1C: 4.9

## 2016-02-26 NOTE — Patient Instructions (Addendum)
Karla Reynolds (Health Maintenance, Female) Un estilo de vida saludable y los cuidados preventivos pueden favorecer considerablemente a la salud y Musician. Pregunte a su mdico cul es el cronograma de exmenes peridicos apropiado para usted. Esta es una buena oportunidad para consultarlo sobre cmo prevenir enfermedades y [place] sano. Adems de los controles, hay muchas otras cosas que puede hacer usted mismo. Los expertos han realizado numerosas investigaciones ArvinMeritor cambios en el estilo de vida y las medidas de prevencin que, [place], lo ayudarn a mantenerse sano. Solicite a su mdico ms informacin. EL PESO Y LA DIETA  Consuma una dieta saludable.  Asegrese de [employer] Stores verduras, frutas, productos lcteos de bajo contenido de [place] y Advertising account planner.  No consuma muchos alimentos de alto contenido de grasas slidas, azcares agregados o sal.  Realice actividad fsica con regularidad. Esta es una de las prcticas ms importantes que puede hacer por su salud.  La mayora de los adultos deben hacer ejercicio durante al menos 173mnutos por semana. El ejercicio debe aumentar la frecuencia cardaca y pActorla transpiracin (ejercicio de iEnola.  La mayora de los adultos tambin deben hField seismologistejercicios de elongacin al mToysRusveces a la semana. Agregue esto al su plan de ejercicio de intensidad moderada. Mantenga un peso saludable.  El ndice de masa corporal ([facility] es una medida que puede utilizarse para identificar posibles problemas de pBelle Valley Proporciona una estimacin de la grasa corporal basndose en el peso y la altura. Su mdico puede ayudarle a dRadiation protection practitionerIAvenely a lScientist, forensico mTheatre managerun peso saludable.  Para las mujeres de 20aos o ms:  Un IMadera Ambulatory Endoscopy Centermenor de 18,5 se considera bajo peso.  Un IAbbeville Area Medical Centerentre 18,5 y 24,9 es normal.  Un IMethodist Hospitalentre 25 y 29,9 se considera sobrepeso.  Un IMC de 30 o ms se considera  obesidad. Observe los niveles de colesterol y lpidos en la sangre.  Debe comenzar a rEnglish as a second language teacherde lpidos y cResearch officer, trade unionen la sangre a los 20aos y luego repetirlos cada 513aos  Es posible que nAutomotive engineerlos niveles de colesterol con mayor frecuencia si:  Sus niveles de lpidos y colesterol son altos.  Es mayor de 50aos.  Presenta un alto riesgo de padecer enfermedades cardacas. DETECCIN DE CNCER  Cncer de pulmn  Se recomienda realizar exmenes de deteccin de cncer de pulmn a personas adultas entre 557y 833aos que estn en riesgo de dHorticulturist, commercialde pulmn por sus antecedentes de consumo de tabaco.  Se recomienda una tomografa computarizada de baja dosis de los pLiberty Mediaaos a las personas que:  Fuman actualmente.  Hayan dejado el hbito en algn momento en los ltimos 15aos.  Hayan fumado durante 30aos un paquete diario. Un paquete-ao equivale a fumar un promedio de un paquete de cigarrillos diario durante un ao.  Los exmenes de deteccin anuales deben continuar hasta que hayan pasado 15aos desde que dej de fumar.  Ya no debern realizarse si tiene un problema de salud que le impida recibir tratamiento para eScience writerde pulmn. Cncer de mama  Practique la autoconciencia de la mama. Esto significa reconocer la apariencia normal de sus mamas y cmo las siente.  Tambin significa realizar autoexmenes regulares de lJohnson & Johnson Informe a su mdico sobre cualquier cambio, sin importar cun pequeo sea.  Si tiene entre 20 y 31aos, un mdico debe realizarle un examen clnico de las mBrunswick Corporationparte del examen regular de sHohenwald cKentucky  1 a 3aos.  Si tiene 40aos o ms, debe realizarse un examen clnico de las mamas todos los aos. Tambin considere realizarse una radiografa de las mamas (mamografa) todos los aos.  Si tiene antecedentes familiares de cncer de mama, hable con su mdico para someterse a un estudio  gentico.  Si tiene alto riesgo de padecer cncer de mama, hable con su mdico para someterse a una resonancia magntica y una mamografa todos los aos.  La evaluacin del gen del cncer de mama (BRCA) se recomienda a mujeres que tengan familiares con cnceres relacionados con el BRCA. Los cnceres relacionados con el BRCA incluyen los siguientes:  Mama.  Ovario.  Trompas.  Cnceres de peritoneo.  Los resultados de la evaluacin determinarn la necesidad de asesoramiento gentico y de anlisis de BRCA1 y BRCA2. Cncer de cuello del tero El mdico puede recomendarle que se haga pruebas peridicas de deteccin de cncer de los rganos de la pelvis (ovarios, tero y vagina). Estas pruebas incluyen un examen plvico, que abarca controlar si se produjeron cambios microscpicos en la superficie del cuello del tero (prueba de Papanicolaou). Pueden recomendarle que se haga estas pruebas cada 3aos, a partir de los 21aos.  A las mujeres que tienen entre 30 y 65aos, los mdicos pueden recomendarles que se sometan a exmenes plvicos y pruebas de Papanicolaou cada 3aos, o a la prueba de Papanicolaou y el examen plvico en combinacin con estudios de deteccin del virus del papiloma humano (VPH) cada 5aos. Algunos tipos de VPH aumentan el riesgo de padecer cncer de cuello del tero. La prueba para la deteccin del VPH tambin puede realizarse a mujeres de cualquier edad cuyos resultados de la prueba de Papanicolaou no sean claros.  Es posible que otros mdicos no recomienden exmenes de deteccin a mujeres no embarazadas que se consideran sujetos de bajo riesgo de padecer cncer de pelvis y que no tienen sntomas. Pregntele al mdico si un examen plvico de deteccin es adecuado para usted.  Si ha recibido un tratamiento para el cncer cervical o una enfermedad que podra causar cncer, necesitar realizarse una prueba de Papanicolaou y controles durante al menos 20 aos de concluido el  tratamiento. Si no se ha hecho el Papanicolaou con regularidad, debern volver a evaluarse los factores de riesgo (como tener un nuevo compaero sexual), para determinar si debe realizarse los estudios nuevamente. Algunas mujeres sufren problemas mdicos que aumentan la probabilidad de contraer cncer de cuello del tero. En estos casos, el mdico podr indicar que se realicen controles y pruebas de Papanicolaou con ms frecuencia. Cncer colorrectal  Este tipo de cncer puede detectarse y a menudo prevenirse.  Por lo general, los estudios de rutina se deben comenzar a hacer a partir de los 50 aos y hasta los 75 aos.  Sin embargo, el mdico podr aconsejarle que lo haga antes, si tiene factores de riesgo para el cncer de colon.  Tambin puede recomendarle que use un kit de prueba para hallar sangre oculta en la materia fecal.  Es posible que se use una pequea cmara en el extremo de un tubo para examinar directamente el colon (sigmoidoscopia o colonoscopia) a fin de detectar formas tempranas de cncer colorrectal.  Los exmenes de rutina generalmente comienzan a los 50aos.  El examen directo del colon se debe repetir cada 5 a 10aos hasta los 75aos. Sin embargo, es posible que se realicen exmenes con mayor frecuencia, si se detectan formas tempranas de plipos precancerosos o pequeos bultos. Cncer de piel  Revise   la piel de la cabeza a los pies con regularidad.  Informe a su mdico si aparecen nuevos lunares o los que tiene se modifican, especialmente en su forma y color.  Tambin notifique al mdico si tiene un lunar que es ms grande que el tamao de una goma de lpiz.  Siempre use pantalla solar. Aplique pantalla solar de Kerry Dory y repetida a lo largo del Training and development officer.  Protjase usando mangas y [employer] Service[employer] Company, un sombrero de ala ancha y gafas para el sol, siempre que se encuentre en el exterior. ENFERMEDADES CARDACAS, DIABETES E HIPERTENSIN ARTERIAL   La hipertensin  arterial causa enfermedades cardacas y [place] el riesgo de ictus. La hipertensin arterial es ms probable en los siguientes casos:  Las personas que tienen la presin arterial en el extremo del rango normal (100-139/85-89 mm Hg).  Las personas con sobrepeso u obesidad.  Las Retail banker.  Si usted tiene entre 18 y 39 aos, debe medirse la presin arterial cada 3 a 5 aos. Si usted tiene 40 aos o ms, debe medirse la presin arterial [employer]lett[employer]ard. Debe medirse la presin arterial dos veces: una vez cuando est en un hospital o una clnica y la otra vez cuando est en otro sitio. Registre el promedio de [employer] Department Stores. Para controlar su presin arterial cuando no est en un hospital o [place], puede usar lo siguiente:  Ardelia Mems mquina automtica para medir la presin arterial en una farmacia.  Un monitor para medir la presin arterial en el hogar.  Si tiene entre 33 y 105 aos, consulte a su mdico si debe tomar aspirina para prevenir el ictus.  Realcese exmenes de deteccin de la diabetes con regularidad. Esto incluye la toma de [place] de sangre para controlar el nivel de azcar en la sangre durante el [place].  Si tiene un peso normal y un bajo riesgo de padecer diabetes, realcese este anlisis cada tres aos despus de los 45aos.  Si tiene sobrepeso y un alto riesgo de padecer diabetes, considere someterse a este anlisis antes o con mayor frecuencia. PREVENCIN DE INFECCIONES  HepatitisB  Si tiene un riesgo ms alto de Museum/gallery curator hepatitis B, debe someterse a un examen de deteccin de este virus. Se considera que tiene un alto riesgo de Museum/gallery curator hepatitis B si:  Naci en un pas donde la hepatitis B es frecuente. Pregntele a su mdico qu pases son considerados de Public affairs consultant.  Sus padres nacieron en un pas de alto riesgo y usted no recibi una vacuna que lo proteja contra la hepatitis B (vacuna contra la hepatitis B).  [place].  [place] agujas  para inyectarse drogas.  Vive con alguien que tiene hepatitis B.  Ha tenido sexo con alguien que tiene hepatitis B.  Recibe tratamiento de hemodilisis.  Toma ciertos medicamentos para el cncer, trasplante de rganos y afecciones autoinmunitarias. Hepatitis C  Se recomienda un anlisis de [place] para:  Todos los que nacieron entre 1943 y [id number].  Todas las personas que tengan un riesgo de haber contrado hepatitis C. Enfermedades de transmisin sexual (ETS).  Debe realizarse pruebas de deteccin de enfermedades de transmisin sexual (ETS), incluidas gonorrea y clamidia si:  Es sexualmente activo y es menor de 24aos.  Es mayor de 24aos, y Investment banker, operational informa que corre riesgo de tener este tipo de infecciones.  La actividad sexual ha cambiado desde que le hicieron la ltima prueba de deteccin y tiene un riesgo mayor de Best boy clamidia o Radio broadcast assistant. Pregntele  al mdico si usted tiene riesgo.  Si no tiene el VIH, pero corre riesgo de infectarse por el virus, se recomienda tomar diariamente un medicamento recetado para evitar la infeccin. Esto se conoce como profilaxis previa a la exposicin. Se considera que est en riesgo si:  Es [place] sexualmente y no [place] preservativos habitualmente o no conoce el estado del VIH de sus Advertising copywriter.  Se inyecta drogas.  Es [place] sexualmente con Ardelia Mems pareja que tiene VIH. Consulte a su mdico para saber si tiene un alto riesgo de infectarse por el VIH. Si opta por comenzar la profilaxis previa a la exposicin, primero debe realizarse anlisis de deteccin del VIH. Luego, le harn anlisis cada 39mses mientras est tomando los medicamentos para la profilaxis previa a la exposicin.  [facility]  Si es premenopusica y puede quedar eDelton solicite a su mdico asesoramiento previo a la concepcin.  Si puede quedar embarazada, tome 400 a [id number](mcg) de cido fAnheuser-Busch  Si desea evitar el embarazo, hable con su  mdico sobre el control de la natalidad (anticoncepcin). OSTEOPOROSIS Y MENOPAUSIA   La osteoporosis es una enfermedad en la que los huesos pierden los minerales y la fuerza por el avance de la edad. El resultado pueden ser fracturas graves en los hAvalon El riesgo de osteoporosis puede identificarse con uArdelia Memsprueba de densidad sea.  Si tiene 65aos o ms, o si est en riesgo de sufrir osteoporosis y fracturas, pregunte a su mdico si debe someterse a exmenes.  Consulte a su mdico si debe tomar un suplemento de calcio o de vitamina D para reducir el riesgo de osteoporosis.  La menopausia puede presentar ciertos sntomas fsicos y rGaffer  La terapia de reemplazo hormonal puede reducir algunos de estos sntomas y rGaffer Consulte a su mdico para saber si la terapia de reemplazo hormonal es conveniente para usted.  INSTRUCCIONES PARA EL CUIDADO EN EL HOGAR   Realcese los estudios de rutina de la salud, dentales y de lPublic librarian  MZanesfield  No consuma ningn producto que contenga tabaco, lo que incluye cigarrillos, tabaco de mHigher education careers advisero cPsychologist, sport and exercise  Si est embarazada, no beba alcohol.  Si est amamantando, reduzca el consumo de alcohol y la frecuencia con la que consume.  Si es mujer y no est embarazada limite el consumo de alcohol a no ms de 1 medida por da. Una medida equivale a 12onzas de cerveza, 5onzas de vino o 1onzas de bebidas alcohlicas de alta graduacin.  No consuma drogas.  No comparta agujas.  Solicite ayuda a su mdico si necesita apoyo o informacin para abandonar las drogas.  Informe a su mdico si a menudo se siente deprimido.  Notifique a su mdico si alguna vez ha sido vctima de abuso o si no se siente seguro en su hogar.   Esta informacin no tiene cMarine scientistel consejo del mdico. Asegrese de hacerle al mdico cualquier pregunta que tenga.   Document Released: 07/02/2011 Document Revised:  08/03/2014 Elsevier Interactive Patient Education 2016 EBlue Islandpara bPharmacist, hospital(Exercising to LIngram Micro Inc Hacer ejercicio puede ayudarlo a bSports coachde peso. Para bajar de pUniversal Healthejercicio, este debe ser de intensidad vigorosa. Puede saber que est haciendo ejercicio de intensidad vigorosa si respira con mucha dificultad y rapidez, y no puede mantener una conversacin. El ejercicio de intensidad moderada ayuda a mTheatre managerel peso actual. Puede saber que est haciendo ejercicio de  intensidad moderada si tiene una frecuencia cardaca ms elevada y [place] respiracin ms rpida, pero an [place]. [place]? Elija una actividad que disfrute y establezca objetivos realistas. El mdico puede ayudarlo a Paediatric nurse un plan de actividades que funcione para usted. Haga ejercicio regularmente como se lo haya indicado el mdico. Esta puede incluir:  Neurosurgeon de resistencia dos veces por semana, como:  Flexiones de [place].  Abdominales.  Levantamiento de pesas.  Ejercicios con bandas elsticas.  Realizar una intensidad determinada de ejercicio durante una cantidad determinada de [place]. Elija entre estas opciones:  163mnutos de ejercicio de intensidad moderada cada semana.  759mutos de ejercicio de intensidad vigorosa cada semana.  UnWaldron Labse ejercicio de intensidad moderada y vigorosa cada semana. Los nios, las mujeres emRocky Boy Westlas personas que no estn en forma, las personas con sobrepeso y los adultos mayores tal vez tengan que consultar a un mdico para que les d reGlass blower/designerSi tiene alCommercial Metals Companyasegrese de coTeacher, adult educationl mdico antes de comenzar un programa de ejercicios nuevo. CUBonduel  Caminar a un ritmo de al menos 4,11m31mas (7kilmetros) por hora.  Trotar o correr a un ritmo de 5 millas (8  kilmetros) por hora.  Andar en bicicleta a un ritmo de al menos 10 millas (16 kilmetros) por hora.  Practicar natacin.  Practicar patinaje sobre ruedas normales o en lnea.  Hacer esqu de fondo.  Hacer deportes competitivos vigorosos, como ftbol americano, bsquet y ftbol.  Saltar la soga.  Tomar clases de baile aerbico. CMO PUEDO SER MS ACTIVO EN MIS ACTIVIDADES DIARIAS?  Utilice las escClinical cytogeneticistl ascensor.  D una caminata durante su hora de almuerzo.  Si conduce, estacione el automvil ms lejos del trabajo o de la escuela.  Si usaCanadaansporte pblico, bjese una parada antes y camine el resto del camino.  Pngase de pie y camine cada vez que haga llamadas telefnicas.  Levntese, estrese y camine cada 17m58mos a lo largo del da. Training and development officer PGarrisono haga ejercicio en exceso que pudiera hacer que se lastime, se sienta mareado o tenga dificultad para respirar.  Consulte al mdico antes de comenzar un programa de ejercicios nuevo.  Use ropa cmoda y calzado con buen soporte.  Beba gran cantidad de agua mientras hace ejercicios para evitar la deshidratacin o los golpes de caloFreight forwarderrante la actividad fsica se pierde agua corporal que se debe reponer.  Haga ejercicio hasta que se acelere su respiracin y sus latidos cardacos.   Esta informacin no tiene comoMarine scientistconsejo del mdico. Asegrese de hacerle al mdico cualquier pregunta que tenga.   Document Released: 10/17/2010 Document Revised: 08/03/2014 Elsevier Interactive Patient Education 2016 ElseEdgewoodcaloras para bajar de peso (Calorie Counting for WeigMassachusetts Mutual Lifes) Las caloras son energa que se obtiene de lo que se come y se bebe. El organismo usa [place]a energa para mantenerlo actiCurator cantidad de caloras que come tiene incidencia en eLubrizol Corporationando come ms caloras de las que el cuerpo necesita, este acumula  las caloras extra comoCircle Cityando come menoUniversal Healthlas que el cuerpo necePremontte quema grasa para obtener la energa que requiere. El recuento de caloras es el registro de la cantidad de caloras que come y bebePharmacologist se asegura de comer menos caloras de  las que el cuerpo necesita, debe bajar de Minnetrista. Para que el recuento de caloras funcione, tendr que comer la cantidad de caloras adecuadas para usted en un da, para bajar una cantidad de peso saludable por semana. Una cantidad de peso saludable para bajar por semana suele ser [place] 1 y Ivar Drape (0,5 a 0,9kg). Un nutricionista puede determinar la cantidad de caloras que necesita por da y sugerirle cmo alcanzar su objetivo calrico.  CUL ES MI PLAN? Mi objetivo es comer 2500 caloras por da.  Si como esta cantidad de caloras por da, debo bajar unas   1  libras por semana. QU DEBO SABER ACERCA DEL [place]? A fin de alcanzar su objetivo diario de caloras, tendr que:  Averiguar cuntas caloras hay en cada alimento que le Therapist, occupational. Intente hacerlo antes de comer.  Decida la cantidad que puede comer del alimento.  Anote lo que comi y cuntas caloras tena. Esta tarea se conoce como llevar un registro de comidas. DNDE ENCUENTRO INFORMACIN SOBRE LAS CALORAS? Es posible Animator cantidad de caloras que contiene un alimento en la etiqueta de informacin nutricional. Tenga en cuenta que toda la informacin que se incluye en una etiqueta se basa en una porcin especfica del alimento. Si un alimento no tiene una etiqueta de informacin nutricional, intente buscar las caloras en Internet o pida ayuda al nutricionista. CMO DECIDO CUNTO COMER? Para decidir qu cantidad del alimento puede comer, tendr que tener en cuenta el nmero de caloras en una porcin y el tamao de una porcin. Es posible Financial trader en la etiqueta de informacin nutricional. Si un alimento no tiene una  etiqueta de informacin nutricional, busque los [place] en Internet o pida ayuda al nutricionista. Recuerde que las caloras se calculan por porcin. Si opta por comer ms de una porcin de un alimento, tendr Tenneco Inc las caloras por porcin por la cantidad de porciones que planea comer. Por ejemplo, la etiqueta de un envase de pan puede decir que el tamao de una porcin es [place], y que una porcin tiene 90caloras. Si come 1rodaja, habr comido 90caloras. Si come 2rodajas, habr comido 180caloras. [place]? Despus de cada comida, registre la siguiente informacin en el registro de comidas:  Lo que comi.  La cantidad que comi.  La cantidad de caloras que tena.  Luego, sume las caloras. Tenga a Materials engineer de comidas, por ejemplo, en un anotador de bolsillo. Otra alternativa es usar una aplicacin en el telfono mvil o un sitio web. Algunos programas calcularn las caloras y [employer] Stores la cantidad de caloras que le [place] vez que agregue un alimento al Control and instrumentation engineer. CULES SON ALGUNOS CONSEJOS PARA EL [place]?  Use las caloras de los alimentos y las bebidas que lo sacien y no lo dejen con apetito, por ejemplo, frutos secos y Engineer, mining de frutos secos, verduras, protenas magras y alimentos con alto contenido de fibra (ms de 5g de fibra por porcin).  Coma alimentos nutritivos y evite las caloras vacas. Las caloras vacas son aquellas que se obtienen de los alimentos o las bebidas que no contienen muchos nutrientes, como los dulces y los refrescos. Es mejor comer una comida nutritiva altamente calrica (como un aguacate) que una con pocos nutrientes (como una bolsa de patatas fritas).  Sepa cuntas caloras tienen los alimentos que come con ms frecuencia. eBay, no tiene que buscar este dato cada vez que los come.  Est atento a  los alimentos que pueden parecer hipocalricos, pero que en realidad contienen  muchas caloras, como los productos de [place], los refrescos y los dulces sin grasa.  Preste atencin a las Automatic Data, [employer] Resources refrescos, las bebidas a base de [place], las bebidas con alcohol y los jugos, que contienen muchas caloras, pero no le dan saciedad. Opte por las bebidas bajas en caloras, como el agua y las bebidas dietticas.  Concntrese en tratar de contar las caloras de los alimentos que tienen la mayor cantidad de caloras. Registrar las caloras de una ensalada que solo contiene hortalizas es menos importante que calcular las de un batido de [place].  Encuentre un mtodo para controlar las caloras que funcione para usted. Sea creativo. La State Farm de las personas que alcanzan el xito encuentran mtodos para llevar un registro de cunto comen en un da, incluso si no cuentan cada calora. CULES SON ALGUNOS CONSEJOS PARA CONTROLAR LAS PORCIONES?  Sepa cuntas caloras hay en una porcin. Esto lo ayudar a saber cuntas porciones de un alimento determinado puede comer.  Use una taza medidora para medir los Northrop Grumman, lo que es muy til al principio. Con el tiempo, podr hacer un clculo estimativo de los tamaos de las porciones de algunos alimentos.  Dedique tiempo a poner porciones de diferentes alimentos en sus platos, tazones y tazas predilectos, a fin de saber cmo se ve una porcin.  Intente no comer directamente de [place] bolsa o una caja, ya que esto puede llevarlo a comer en exceso. Ponga la cantidad Land O'Lakes gustara comer en una taza o un plato, a fin de asegurarse de que est comiendo la porcin correcta.  Use platos, vasos y tazones ms pequeos para no comer en exceso. Esta es una forma rpida y sencilla de poner en prctica el control de las porciones. Si el plato es ms pequeo, le caben menos alimentos.  Intente no hacer muchas tareas mientras come, como ver televisin o usar la computadora. Si es la hora de comer, sintese a Conservation officer, nature y  disfrute de Environmental education officer. Esto lo ayudar a que empiece a Marine scientist cundo est satisfecho. Tambin le permitir estar ms consciente de lo que come y cunto est comiendo. [place]?  Pida porciones ms pequeas o porciones para nios.  Considere la posibilidad de Publishing rights manager un plato principal y las guarniciones, en lugar de pedir su propio plato principal.  Si pide su propio plato principal, coma solo la mitad. Pida una caja al comienzo de la comida y ponga all el resto del plato principal, para no sentir la tentacin de comerlo.  Busque las caloras en el men. Si se detallan las caloras, elija las opciones que contengan la menor cantidad.  Elija platos que incluyan verduras, frutas, cereales integrales, productos lcteos con bajo contenido de grasa y Advertising account planner. Centrarse en elegir con inteligencia alimentos de cada uno de los 5grupos que puede ayudarlo a seguir por el buen camino en los restaurantes.  Opte por los alimentos hervidos, asados, cocidos a la parrilla o al vapor.  Elija el agua, la [place], [place] t helado sin azcar u otras bebidas que no contengan azcares agregados. Si desea una bebida alcohlica, escoja una opcin con menos caloras. Por ejemplo, un margarita normal puede [employer], y un vaso de vino tiene unas 150.  No coma alimentos que contengan mantequilla, estn empanados, fritos o que se sirvan con salsa a base de crema.  Generalmente, los alimentos que se etiquetan como "crujientes" estn fritos, a menos que se indique lo contrario.  Ordene los Kimberly-Clark, las salsas y los jarabes aparte, ya que suelen tener muchas caloras; por lo tanto, no los consuma en grandes cantidades.  Tenga cuidado con las [place]. Muchas personas piensan que las ensaladas son Ardelia Mems opcin saludable, pero en muchas cosas, esto no es as. Hay muchas ensaladas que contienen tocino, pollo frito, grandes cantidades de Martha, patatas  fritas y Lexicographer. Todos estos productos son altamente calricos. Si desea Katherine Mantle, elija una de hortalizas y pida carnes a la parrilla o un filete. Ordene el aderezo aparte o pida aceite de [place] y vinagre o limn para Haematologist.  Haga un clculo estimativo de la cantidad de porciones que le sirven. Por ejemplo, una porcin de arroz cocido equivale a media taza o tiene el tamao aproximado de un molde de [place], o de media pelota de tenis. Conocer el tamao de las porciones lo ayudar a Personnel officer atento a la cantidad de comida que come [employer]. La lista que sigue le [place] el tamao de algunas porciones comunes a partir de objetos cotidianos.  1onza (28g) = 4dados apilados.  3onzas (85g) = 15mzo de cartas.  1cucharadita = 1dado.  1cucharada = media pelota de tenis de mesa.  2cucharadas = 1pelota de tenis de mesa.  Media taza = 1pelota de tenis o 150moe de magdalena.  1taza = 1 pelota de bisbol.   Esta informacin no tiene coMarine scientistl consejo del mdico. Asegrese de hacerle al mdico cualquier pregunta que tenga.   Document Released: 10/29/2008 Document Revised: 08/03/2014 Elsevier Interactive Patient Education 20[employer]

## 2016-02-26 NOTE — Progress Notes (Signed)
Karla Reynolds, is a 35 y.o. female  WUJ:[id number]  [id number]:[id number]  DOB - [birth year]  CC:  Chief Complaint  Patient presents with  . Establish Care       HPI: Karla Reynolds is a 35 y.o. female [id number] here today to establish medical care, last seen in clinic 8/16, back to establish care. She is here w/ her young daughter (who was delivered 11/04/15 in her car in route to hospital) and young son.  She is interested in long term birthcontrol, such as Nexplanon implant.  She states she had regular menses on 7/24.  C/o of ongoing vaginal discharge last 3 weeks with dysuria, but denies vaginal pruritis or pain.   She states the discharge is thick, sometimes yellow or dark in coloration.  Patient has No headache, no f/c, No chest pain, No abdominal pain - No Nausea, No new weakness tingling or numbness, No Cough - SOB.  Interpreter was used to communicate directly with patient for the entire encounter including providing detailed patient instructions.   Review of Systems: Per HPI, o/w all systems reviewed and negative.   Allergies  Allergen Reactions  . Penicillins Other (See Comments)    Body goes to sleep Has patient had a PCN reaction causing immediate rash, facial/tongue/throat swelling, SOB or lightheadedness with hypotension: Yes Has patient had a PCN reaction causing severe rash involving mucus membranes or skin necrosis: No Has patient had a PCN reaction that required hospitalization No Has patient had a PCN reaction occurring within the last 10 years: No If all of the above answers are "NO", then may proceed with Cephalosporin use.   Past Medical History:  Diagnosis Date  . Asthma    Current Outpatient Prescriptions on File Prior to Visit  Medication Sig Dispense Refill  . ibuprofen (ADVIL,MOTRIN) 600 MG tablet Take 1 tablet (600 mg total) by mouth every 6 (six) hours. 30 tablet 0  . [DISCONTINUED] sertraline (ZOLOFT) 50 MG tablet Take 1 tablet (50 mg  total) by mouth daily. (Patient not taking: Reported on 09/21/2014) 30 tablet 3   No current facility-administered medications on file prior to visit.    No family history on file. Social History   Social History  . Marital status: Single    Spouse name: N/A  . Number of children: N/A  . Years of education: N/A   Occupational History  . Not on file.   Social History Main Topics  . Smoking status: Never Smoker  . Smokeless tobacco: Not on file  . Alcohol use No  . Drug use: No  . Sexual activity: Not on file   Other Topics Concern  . Not on file   Social History Narrative  . No narrative on file    Objective:   Vitals:   02/26/16 1126  BP: 115/79  Pulse: 74  Resp: 16  Temp: 98 F (36.7 C)    Filed Weights   02/26/16 1126  Weight: 181 lb (82.1 kg)    BP Readings from Last 3 Encounters:  02/26/16 115/79  11/05/15 120/69  11/01/15 110/70   Pelvic exam done w/ CMA assistance.  Physical Exam: Constitutional: Patient appears well-developed and well-nourished. No distress. AAOx3, obese, pleasant. HENT: Normocephalic, atraumatic, External right and left ear normal. Oropharynx is clear and moist.  Eyes: Conjunctivae and EOM are normal. PERRL, no scleral icterus. Neck: Normal ROM. Neck supple. No JVD.  CVS: RRR, S1/S2 +, no murmurs, no gallops, no carotid bruit.  Pulmonary: Effort and breath sounds normal,  no stridor, rhonchi, wheezes, rales.  Abdominal: Soft. BS +, obese, no tenderness, rebound or guarding.  Musculoskeletal: Normal range of motion. No edema and no tenderness.  Pelvic Exam: Cervix rotated downward, did not see cervix due to patient discomfort, external genitalia normal, no adnexal masses or tenderness, no cervical motion tenderness, rectovaginal septum normal, uterus normal size, shape, and consistency and vagina with thick white discharge. Small skin tag on right inner thigh.  LE: bilat/ no c/c/e, pulses 2+ bilateral. Lymphadenopathy: No  lymphadenopathy noted, cervical or inguinal. Neuro: Alert.   muscle tone coordination wnl. No cranial nerve deficit grossly. Skin: Skin is warm and dry. No rash noted. Not diaphoretic. No erythema. No pallor. Psychiatric: Normal mood and affect. Behavior, judgment, thought content normal.  Lab Results  Component Value Date   WBC 13.8 (H) 11/04/2015   HGB 12.4 11/04/2015   HCT 35.6 (L) 11/04/2015   MCV 82.2 11/04/2015   PLT 182 11/04/2015   Lab Results  Component Value Date   CREATININE 0.54 04/01/2015   BUN 9 04/01/2015   NA 134 (L) 04/01/2015   K 3.4 (L) 04/01/2015   CL 103 04/01/2015   CO2 22 04/01/2015    Lab Results  Component Value Date   HGBA1C 5.5 02/23/2014   Lipid Panel     Component Value Date/Time   CHOL 187 02/23/2014 1052   TRIG 69 02/23/2014 1052   HDL 48 02/23/2014 1052   CHOLHDL 3.9 02/23/2014 1052   VLDL 14 02/23/2014 1052   LDLCALC 125 (H) 02/23/2014 1052       Depression screen PHQ 2/9 02/26/2016 11/01/2015 10/21/2015 10/15/2015 07/12/2015  Decreased Interest 1 0 0 0 0  Down, Depressed, Hopeless 1 0 0 0 0  PHQ - 2 Score 2 0 0 0 0  Altered sleeping 1 - - - -  Tired, decreased energy 2 - - - -  Change in appetite 3 - - - -  Feeling bad or failure about yourself  0 - - - -  Trouble concentrating 2 - - - -  Moving slowly or fidgety/restless 2 - - - -  Suicidal thoughts 0 - - - -  PHQ-9 Score 12 - - - -    Assessment and plan:   1. Vaginal discharge papsmear done 2015, did not attempt too many times to locate pt's cervix due to discomfort. - Cervicovaginal ancillary only - HIV antibody (with reflex) - BASIC METABOLIC PANEL WITH GFR - CBC with Differential/Platelet  2. Dysuria - Urinalysis Dipstick  3. Diabetes mellitus screening - POCT glycosylated hemoglobin (Hb A1C)  4. Birth control counseling Pt interested in some type of birthcontrol, discussed with her ocp, depomedrol shot q74months, nexplanon implant q3 years, iud q 5 years. She is  interested in Nexplanon implant. - Ambulatory referral to Obstetrics / Gynecology - for eval of Nexplanon implant.  5. Health Maintenance Pap smear due in 2018  6. Morbid obesity - info given on diet and exercise to help w/ weightloss.  Return in about 6 months (around 08/28/2016), or if symptoms worsen or fail to improve.  The patient was given clear instructions to go to ER or return to medical center if symptoms don't improve, worsen or new problems develop. The patient verbalized understanding. The patient was told to call to get lab results if they haven't heard anything in the next week.    This note has been created with Education officer, environmental. Any transcriptional errors are unintentional.  Pete Glatter, MD, MBA/MHA [facility] [place], [place] [phone]   02/26/2016, 11:52 AM

## 2016-02-27 LAB — CERVICOVAGINAL ANCILLARY ONLY
Chlamydia: NEGATIVE
Neisseria Gonorrhea: NEGATIVE
Wet Prep (BD Affirm): NEGATIVE

## 2016-02-27 LAB — HIV ANTIBODY (ROUTINE TESTING W REFLEX): HIV 1&2 Ab, 4th Generation: NONREACTIVE

## 2016-02-28 ENCOUNTER — Other Ambulatory Visit: Payer: Self-pay | Admitting: Internal Medicine

## 2016-02-28 ENCOUNTER — Telehealth: Payer: Self-pay

## 2016-02-28 ENCOUNTER — Telehealth: Payer: Self-pay | Admitting: Internal Medicine

## 2016-02-28 MED ORDER — FLUCONAZOLE 150 MG PO TABS: 150.0000 mg | ORAL_TABLET | Freq: Every day | ORAL | 0 refills | Status: DC

## 2016-02-28 MED ORDER — NYSTATIN 100000 UNIT/GM EX POWD: Freq: Four times a day (QID) | CUTANEOUS | 0 refills | Status: DC

## 2016-02-28 NOTE — Telephone Encounter (Signed)
[employer] Jesusita Oka [phone] contacted pt to go over lab results pt is aware of lab results and doesn't have any questions

## 2016-02-28 NOTE — Telephone Encounter (Signed)
[facility] calling stating that the test that was ordered can not be performed by the specimens that were taken   CB#: [phone] No specific contact person  Please assist, states they have been calling for two days

## 2016-02-28 NOTE — Telephone Encounter (Signed)
Spoke with Corrie Dandy from [facility] and she stated they were unable to do the HSV and the 16/18 because it would have been in a thin prep. Made Dr. Lana Fish aware and per Dr. Julien Nordmann that is okay

## 2016-06-24 ENCOUNTER — Inpatient Hospital Stay (HOSPITAL_COMMUNITY)
Admission: AD | Admit: 2016-06-24 | Discharge: 2016-06-24 | Disposition: A | Discharge status: Home / Self Care | Payer: Self-pay | Source: Ambulatory Visit | Attending: Obstetrics & Gynecology | Admitting: Obstetrics & Gynecology

## 2016-06-24 ENCOUNTER — Inpatient Hospital Stay (HOSPITAL_COMMUNITY): Payer: Self-pay

## 2016-06-24 ENCOUNTER — Encounter (HOSPITAL_COMMUNITY): Payer: Self-pay | Admitting: *Deleted

## 2016-06-24 DIAGNOSIS — O26892 Other specified pregnancy related conditions, second trimester: Secondary | ICD-10-CM | POA: Insufficient documentation

## 2016-06-24 DIAGNOSIS — Z3A18 18 weeks gestation of pregnancy: Secondary | ICD-10-CM | POA: Insufficient documentation

## 2016-06-24 DIAGNOSIS — O26899 Other specified pregnancy related conditions, unspecified trimester: Secondary | ICD-10-CM

## 2016-06-24 DIAGNOSIS — O99512 Diseases of the respiratory system complicating pregnancy, second trimester: Secondary | ICD-10-CM | POA: Insufficient documentation

## 2016-06-24 DIAGNOSIS — R109 Unspecified abdominal pain: Secondary | ICD-10-CM | POA: Insufficient documentation

## 2016-06-24 DIAGNOSIS — M545 Low back pain: Secondary | ICD-10-CM | POA: Insufficient documentation

## 2016-06-24 DIAGNOSIS — Z880 Allergy status to penicillin: Secondary | ICD-10-CM | POA: Insufficient documentation

## 2016-06-24 DIAGNOSIS — Z79899 Other long term (current) drug therapy: Secondary | ICD-10-CM | POA: Insufficient documentation

## 2016-06-24 LAB — URINALYSIS, ROUTINE W REFLEX MICROSCOPIC
Bilirubin Urine: NEGATIVE
Glucose, UA: NEGATIVE mg/dL
Hgb urine dipstick: NEGATIVE
Ketones, ur: 15 mg/dL — AB
Leukocytes, UA: NEGATIVE
Nitrite: NEGATIVE
Protein, ur: NEGATIVE mg/dL
Specific Gravity, Urine: 1.015 (ref 1.005–1.030)
pH: 7 (ref 5.0–8.0)

## 2016-06-24 LAB — CBC
HCT: 31.8 % — ABNORMAL LOW (ref 36.0–46.0)
Hemoglobin: 11.5 g/dL — ABNORMAL LOW (ref 12.0–15.0)
MCH: 29.3 pg (ref 26.0–34.0)
MCHC: 36.2 g/dL — AB (ref 30.0–36.0)
MCV: 80.9 fL (ref 78.0–100.0)
PLATELETS: 195 10*3/uL (ref 150–400)
RBC: 3.93 MIL/uL (ref 3.87–5.11)
RDW: 12.9 % (ref 11.5–15.5)
WBC: 6.6 10*3/uL (ref 4.0–10.5)

## 2016-06-24 LAB — WET PREP, GENITAL
CLUE CELLS WET PREP: NONE SEEN
Sperm: NONE SEEN
Trich, Wet Prep: NONE SEEN
YEAST WET PREP: NONE SEEN

## 2016-06-24 MED ORDER — HYDROMORPHONE HCL 2 MG/ML IJ SOLN
2.0000 mg | Freq: Once | INTRAMUSCULAR | Status: AC
  Administered 2016-06-24: 2 mg via INTRAMUSCULAR
  Filled 2016-06-24: qty 1

## 2016-06-24 MED ORDER — IBUPROFEN 600 MG PO TABS: 600.0000 mg | ORAL_TABLET | Freq: Four times a day (QID) | ORAL | 0 refills | Status: DC | PRN

## 2016-06-24 MED ORDER — CONCEPT OB 130-92.4-1 MG PO CAPS: 1.0000 | ORAL_CAPSULE | Freq: Every day | ORAL | 12 refills | Status: DC

## 2016-06-24 MED ORDER — IBUPROFEN 600 MG PO TABS
600.0000 mg | ORAL_TABLET | Freq: Once | ORAL | Status: AC
  Administered 2016-06-24: 600 mg via ORAL
  Filled 2016-06-24: qty 1

## 2016-06-24 NOTE — MAU Provider Note (Signed)
Chief Complaint: Abdominal Cramping   First Provider Initiated Contact with Patient 06/24/16 0136     SUBJECTIVE HPI: Karla Reynolds is a 35 y.o. [id number] at 7470w1d who presents to Maternity Admissions reporting severe low and low back pain that feels like contractions since 1600 yesterday. [facility] States she had US two weeks ago that was normal.   Location: Suprapubic, low back Quality: contractions.  Severity: 9/10 on pain scale Duration: <12 hours Context: None Timing: intermittent Modifying factors: None. Hasn't tried anything for the pain Associated signs and symptoms: Neg for fever chills, VB, LOF, vaginal discharge, urinary complaints or GI complaints.   Video interpreter used.   Past Medical History:  Diagnosis Date  . Asthma    OB History  Gravida Para Term Preterm AB Living  5 3 3  0 1 3  SAB TAB Ectopic Multiple Live Births  1     0 3    # Outcome Date GA Lbr Len/2nd Weight Sex Delivery Anes PTL Lv  5 Current           4 Term 11/04/15 39100w6d 01:05 / 00:05 6 lb 12.3 oz (3.07 kg) F Vag-Spont None  LIV  3 SAB  3741w0d         2 Term      Vag-Spont   LIV  1 Term      Vag-Spont   LIV     Past Surgical History:  Procedure Laterality Date  . NO PAST SURGERIES     Social History   Social History  . Marital status: Single    Spouse name: N/A  . Number of children: N/A  . Years of education: N/A   Occupational History  . Not on file.   Social History Main Topics  . Smoking status: Never Smoker  . Smokeless tobacco: Not on file  . Alcohol use No  . Drug use: No  . Sexual activity: Not on file   Other Topics Concern  . Not on file   Social History Narrative  . No narrative on file   History reviewed. No pertinent family history. No current facility-administered medications on file prior to encounter.    Current Outpatient Prescriptions on File Prior to Encounter  Medication Sig Dispense Refill  . fluconazole  (DIFLUCAN) 150 MG tablet Take 1 tablet (150 mg total) by mouth daily. 1 tablet 0  . nystatin (MYCOSTATIN/NYSTOP) powder Apply topically 4 (four) times daily. 15 g 0  . [DISCONTINUED] sertraline (ZOLOFT) 50 MG tablet Take 1 tablet (50 mg total) by mouth daily. (Patient not taking: Reported on 09/21/2014) 30 tablet 3   Allergies  Allergen Reactions  . Penicillins Other (See Comments)    Body goes to sleep Has patient had a PCN reaction causing immediate rash, facial/tongue/throat swelling, SOB or lightheadedness with hypotension: Yes Has patient had a PCN reaction causing severe rash involving mucus membranes or skin necrosis: No Has patient had a PCN reaction that required hospitalization No Has patient had a PCN reaction occurring within the last 10 years: No If all of the above answers are "NO", then may proceed with Cephalosporin use.    I have reviewed patient's Past Medical Hx, Surgical Hx, Family Hx, Social Hx, medications and allergies.   Review of Systems  Constitutional: Negative for appetite change (slightly decreased x 1 month.), chills and fever.  Gastrointestinal: Positive for abdominal pain. Negative for abdominal distention, constipation, diarrhea, nausea and vomiting.  Genitourinary: Positive for pelvic pain.  Negative for difficulty urinating, dysuria, flank pain, frequency, hematuria, urgency, vaginal bleeding and vaginal discharge.  Musculoskeletal: Positive for back pain. Negative for gait problem.    OBJECTIVE Patient Vitals for the past 24 hrs:  BP Temp Temp src Pulse Resp Weight  06/24/16 0057 102/83 98.5 F (36.9 C) Oral 83 20 180 lb 12 oz (82 kg)   Constitutional: Well-developed, well-nourished female in mild distress. Moderate distress w/ pelvic exam. Cardiovascular: normal rate Respiratory: normal rate and effort.  GI: Abd soft, mild SP tenderness. No tenderness at McBurney's point. No guarding or palpable mass. Gravid appropriate for gestational age. Pos BS x  4 MS: Low back nontender, normal ROM. Normal gait. Neurologic: Alert and oriented x 4.  GU: Neg CVAT.  SPECULUM EXAM: NEFG, physiologic discharge, no blood noted, Neg pooling, cervix not completely visualized on speculum exam.   BIMANUAL: cervix ?closed, exam limited by patient discomfort; uterus 18-week size, no adnexal tenderness or masses.  FHR 140 by doppler.  LAB RESULTS Results for orders placed or performed during the hospital encounter of 06/24/16 (from the past 24 hour(s))  Urinalysis, Routine w reflex microscopic (not at [facility])     Status: Abnormal   Collection Time: 06/24/16 12:51 AM  Result Value Ref Range   Color, Urine YELLOW YELLOW   APPearance CLEAR CLEAR   Specific Gravity, Urine 1.015 1.005 - 1.030   pH 7.0 5.0 - 8.0   Glucose, UA NEGATIVE NEGATIVE mg/dL   Hgb urine dipstick NEGATIVE NEGATIVE   Bilirubin Urine NEGATIVE NEGATIVE   Ketones, ur 15 (A) NEGATIVE mg/dL   Protein, ur NEGATIVE NEGATIVE mg/dL   Nitrite NEGATIVE NEGATIVE   Leukocytes, UA NEGATIVE NEGATIVE  Wet prep, genital     Status: Abnormal   Collection Time: 06/24/16  1:56 AM  Result Value Ref Range   Yeast Wet Prep HPF POC NONE SEEN NONE SEEN   Trich, Wet Prep NONE SEEN NONE SEEN   Clue Cells Wet Prep HPF POC NONE SEEN NONE SEEN   WBC, Wet Prep HPF POC MODERATE (A) NONE SEEN   Sperm NONE SEEN   CBC     Status: Abnormal   Collection Time: 06/24/16  2:03 AM  Result Value Ref Range   WBC 6.6 4.0 - 10.5 K/uL   RBC 3.93 3.87 - 5.11 MIL/uL   Hemoglobin 11.5 (L) 12.0 - 15.0 g/dL   HCT 16.1 (L) 09.6 - 04.5 %   MCV 80.9 78.0 - 100.0 fL   MCH 29.3 26.0 - 34.0 pg   MCHC 36.2 (H) 30.0 - 36.0 g/dL   RDW 40.9 81.1 - 91.4 %   Platelets 195 150 - 400 K/uL    IMAGING CL 3.5 cm, normal ovaries.   MAU COURSE Orders Placed This Encounter  Procedures  . Wet prep, genital  . Culture, OB Urine  . [place] MFM OB LIMITED  . Urinalysis, Routine w reflex microscopic (not at [facility])  . HIV antibody (routine  testing) (NOT for [facility])  . CBC  . Lab instructions  . Apply heat to affected area   Meds ordered this encounter  Medications  . HYDROmorphone (DILAUDID) injection 2 mg  . ibuprofen (ADVIL,MOTRIN) tablet 600 mg  . ibuprofen (ADVIL,MOTRIN) 600 MG tablet    Sig: Take 1 tablet (600 mg total) by mouth every 6 (six) hours as needed for moderate pain or cramping (Do not use after [redacted] weeks gestation).    Dispense:  30 tablet    Refill:  0  Order Specific Question:   Supervising Provider    Answer:   Adam PhenixARNOLD, JAMES G [[id number]]  . Prenat w/o A Vit-FeFum-FePo-FA (CONCEPT OB) 130-92.4-1 MG CAPS    Sig: Take 1 tablet by mouth daily.    Dispense:  30 capsule    Refill:  12    Order Specific Question:   Supervising Provider    Answer:   Adam PhenixARNOLD, JAMES G [[id number]]   Pain decreased to 5/10. Discussed normal US, labs w/ pt. No clear etiology for pain. No evidence of preterm labor or other emergent condition. Will give Ibuprofen and apply heat to low back.   Pt's sister not able to pick her up until 0800. Will keep pt in MAU due to sedation from Dilaudid.   MDM - Low abd and low back pain possibly due to preterm contractions, but no evidence of active preterm labor/SAB. No evidence of infection. Low suspicion for appendicitis due to location of pain and absence of fever, leukocytosis and GI complaints.   ASSESSMENT 1. Abdominal pain affecting pregnancy, antepartum     PLAN Discharge home in stable condition. Abd pain precautions Follow-up Information    [facility]   Why:  as scheduled for prenatal visit or sooner as needed if symptoms worsen.  Contact information: [address] [place] [id number] [phone]        [facility] Follow up.   Why:  as needed in emergencies Contact information: [address] [id number] [place] [id number] [phone]           Medication  List    STOP taking these medications   fluconazole 150 MG tablet Commonly known as:  DIFLUCAN   nystatin powder Commonly known as:  MYCOSTATIN/NYSTOP     TAKE these medications   CONCEPT OB 130-92.4-1 MG Caps Take 1 tablet by mouth daily.   ibuprofen 600 MG tablet Commonly known as:  ADVIL,MOTRIN Take 1 tablet (600 mg total) by mouth every 6 (six) hours as needed for moderate pain or cramping (Do not use after [redacted] weeks gestation). What changed:  when to take this  reasons to take this      [place] Keilani Terrance, CNM 06/24/2016  4:16 AM

## 2016-06-24 NOTE — MAU Note (Signed)
PT  SAY SSHE  STARTED  CRAMPING  AT 4 PM     PNC  WITH HD

## 2016-06-24 NOTE — MAU Note (Addendum)
Pt states that she feels like she is having contractions in and pressure. Started at 4pm. Rates 9/10. Denies vag bleeding or LOF. States normal BM's-last one yesterday. Denies n/v or urinary s/s.

## 2016-06-24 NOTE — Discharge Instructions (Signed)
Dolor abdominal en el embarazo °(Abdominal Pain During Pregnancy) °El dolor abdominal es frecuente durante el embarazo. Generalmente no causa ningún daño. El dolor abdominal puede tener numerosas causas. Algunas causas son más graves que otras. Ciertas causas de dolor abdominal durante el embarazo se diagnostican fácilmente. A veces, se tarda un tiempo para llegar al diagnóstico. Otras veces la causa no se conoce. El dolor abdominal puede estar relacionado con alguna alteración del embarazo, o puede deberse a una causa totalmente diferente. Por este motivo, siempre consulte a su médico cuando sienta molestias abdominales. °INSTRUCCIONES PARA EL CUIDADO EN EL HOGAR  °Esté atenta al dolor para ver si hay cambios. Las siguientes indicaciones ayudarán a aliviar cualquier molestia que pueda sentir: °· No tenga relaciones sexuales y no coloque nada dentro de la vagina hasta que los síntomas hayan desaparecido completamente. °· Descanse todo lo que pueda hasta que el dolor se le haya calmado. °· Si siente náuseas, beba líquidos claros. Evite los alimentos sólidos mientras sienta malestar o tenga náuseas. °· Tome sólo medicamentos de venta libre o recetados, según las indicaciones del médico. °· Cumpla con todas las visitas de control, según le indique su médico. °SOLICITE ATENCIÓN MÉDICA DE INMEDIATO SI: °· Tiene un sangrado, pérdida de líquidos o elimina tejidos por la vagina. °· El dolor o los cólicos aumentan. °· Tiene vómitos persistentes. °· Comienza a sentir dolor al orinar u observa sangre. °· Tiene fiebre. °· Nota que los movimientos del bebé disminuyen. °· Siente intensa debilidad o se marea. °· Tiene dificultad para respirar con o sin dolor abdominal. °· Siente un dolor de cabeza intenso junto al dolor abdominal. °· Tiene una secreción vaginal anormal con dolor abdominal. °· Tiene diarrea persistente. °· El dolor abdominal sigue o empeora aún después de hacer reposo. °ASEGÚRESE DE QUE:  °· Comprende estas  instrucciones. °· Controlará su afección. °· Recibirá ayuda de inmediato si no mejora o si empeora. °Esta información no tiene como fin reemplazar el consejo del médico. Asegúrese de hacerle al médico cualquier pregunta que tenga. °Document Released: 07/13/2005 Document Revised: 11/04/2015 °Elsevier Interactive Patient Education © 2017 Elsevier Inc. ° °

## 2016-06-25 LAB — CULTURE, OB URINE
Culture: NO GROWTH
SPECIAL REQUESTS: NORMAL

## 2016-06-25 LAB — GC/CHLAMYDIA PROBE AMP (~~LOC~~) NOT AT ARMC
CHLAMYDIA, DNA PROBE: NEGATIVE
NEISSERIA GONORRHEA: NEGATIVE

## 2016-06-25 LAB — HIV ANTIBODY (ROUTINE TESTING W REFLEX): HIV Screen 4th Generation wRfx: NONREACTIVE

## 2016-07-02 ENCOUNTER — Inpatient Hospital Stay (HOSPITAL_COMMUNITY)
Admission: AD | Admit: 2016-07-02 | Discharge: 2016-07-02 | Disposition: A | Discharge status: Home / Self Care | Payer: Self-pay | Source: Ambulatory Visit | Attending: Obstetrics and Gynecology | Admitting: Obstetrics and Gynecology

## 2016-07-02 ENCOUNTER — Encounter (HOSPITAL_COMMUNITY): Payer: Self-pay | Admitting: *Deleted

## 2016-07-02 DIAGNOSIS — M545 Low back pain, unspecified: Secondary | ICD-10-CM

## 2016-07-02 DIAGNOSIS — Z3A19 19 weeks gestation of pregnancy: Secondary | ICD-10-CM | POA: Insufficient documentation

## 2016-07-02 DIAGNOSIS — O26892 Other specified pregnancy related conditions, second trimester: Secondary | ICD-10-CM | POA: Insufficient documentation

## 2016-07-02 DIAGNOSIS — Z880 Allergy status to penicillin: Secondary | ICD-10-CM | POA: Insufficient documentation

## 2016-07-02 LAB — URINALYSIS, ROUTINE W REFLEX MICROSCOPIC
BILIRUBIN URINE: NEGATIVE
Glucose, UA: NEGATIVE mg/dL
Hgb urine dipstick: NEGATIVE
KETONES UR: 20 mg/dL — AB
LEUKOCYTES UA: NEGATIVE
NITRITE: NEGATIVE
PH: 6 (ref 5.0–8.0)
Protein, ur: NEGATIVE mg/dL
Specific Gravity, Urine: 1.021 (ref 1.005–1.030)

## 2016-07-02 MED ORDER — CYCLOBENZAPRINE HCL 10 MG PO TABS: 10.0000 mg | ORAL_TABLET | Freq: Three times a day (TID) | ORAL | 0 refills | Status: DC | PRN

## 2016-07-02 MED ORDER — OXYCODONE-ACETAMINOPHEN 5-325 MG PO TABS: 1.0000 | ORAL_TABLET | Freq: Three times a day (TID) | ORAL | 0 refills | Status: DC | PRN

## 2016-07-02 MED ORDER — CYCLOBENZAPRINE HCL 10 MG PO TABS
10.0000 mg | ORAL_TABLET | Freq: Once | ORAL | Status: AC
  Administered 2016-07-02: 10 mg via ORAL
  Filled 2016-07-02: qty 1

## 2016-07-02 MED ORDER — OXYCODONE-ACETAMINOPHEN 7.5-325 MG PO TABS: 1.0000 | ORAL_TABLET | Freq: Once | ORAL | Status: DC

## 2016-07-02 MED FILL — CYCLOBENZAPRINE 10 MG TAB: 10 | 7 days supply | Qty: 20 | Fill #0

## 2016-07-02 NOTE — Discharge Instructions (Signed)
Dolor de espalda en adultos (Back Pain, Adult) El dolor de espalda es muy frecuente. A menudo mejora con el tiempo. La causa del dolor de espalda generalmente no es peligrosa. La Harley-Davidsonmayora de las personas puede aprender a Runner, broadcasting/film/videomanejar el dolor de espalda por s mismas. CUIDADOS EN EL HOGAR Controle su dolor de espalda a fin de Public house managerdetectar algn cambio. Las siguientes indicaciones ayudarn a Psychologist, clinicalaliviar cualquier dolor que pueda sentir:  Materials engineerMantngase activo. Comience con caminatas cortas sobre superficies planas si es posible. Trate de caminar un poco ms cada da.  Haga ejercicios con regularidad tal como le indic el mdico. El ejercicio ayuda a que su espalda se cure ms rpidamente. Tambin ayuda a prevenir futuras lesiones al Kimberly-Clarkmantener los msculos fuertes y flexibles.  No se siente, conduzca ni permanezca de pie durante ms de 30 minutos.  No permanezca en la cama. Si hace reposo ms de 1 a 2 das, puede demorar su recuperacin.  Sea cuidadoso al inclinarse o levantar un objeto. Use una tcnica apropiada para levantar peso:  Flexione las rodillas.  Mantenga el objeto cerca del cuerpo.  No gire.  Duerma sobre un NVR Inccolchn firme. Recustese sobre un costado y flexione las rodillas. Si se recuesta Fisher Scientificsobre la espalda, coloque una almohada debajo de las rodillas.  Tome los medicamentos solamente como se lo haya indicado el mdico.  Aplique hielo sobre la zona lesionada.  Ponga el hielo en una bolsa plstica.  Coloque una toalla entre la piel y la bolsa de hielo.  Deje el hielo durante 20minutos, 2 a 3veces por da, durante los primeros 2 o 3das. Despus de eso, puede alternar entre compresas de hielo y Airline pilotcalor.  Evite sentir ansiedad o estrs. Encuentre maneras efectivas de lidiar con el estrs, Surveyor, miningcomo hacer ejercicio.  Mantenga un peso saludable. El peso excesivo ejerce tensin sobre la espalda. SOLICITE AYUDA SI:  Siente dolor que no se alivia con reposo o medicamentos.  Siente cada vez ms  dolor que se extiende a las piernas o los glteos.  El dolor no mejora en una semana.  Siente dolor por la noche.  Pierde peso.  Siente escalofros o fiebre. SOLICITE AYUDA DE INMEDIATO SI:  No puede controlar su materia fecal (heces) o el pis (orina).  Siente debilidad en las piernas o los brazos.  Siente prdida de la sensibilidad (adormecimiento) en las piernas o los brazos.  Tiene malestar estomacal (nuseas) o vomita.  Siente dolor de estmago (abdominal).  Siente que se desvanece (se desmaya). Esta informacin no tiene Theme park managercomo fin reemplazar el consejo del mdico. Asegrese de hacerle al mdico cualquier pregunta que tenga. Document Released: 01/26/2011 Document Revised: 08/03/2014 Document Reviewed: 11/14/2013 Elsevier Interactive Patient Education  2017 [employer]

## 2016-07-02 NOTE — MAU Provider Note (Signed)
Chief Complaint:  Back Pain   First Provider Initiated Contact with Patient 07/02/16 1459     HPI: Karla Reynolds is a 35 y.o. [id number] at 4319w2dwho presents to maternity admissions reporting low back pain for several days. Was seen here for the same complaint on 06/24/16 and given Dilaudid and sent home with Ibuprofen, which she says does not help. Denies any strain or lifting. . She denies LOF, vaginal bleeding, vaginal itching/burning, urinary symptoms, h/a, dizziness, n/v, diarrhea, constipation or fever/chills.    Back Pain  This is a recurrent problem. The current episode started in the past 7 days. The problem occurs constantly. The problem is unchanged. The pain is present in the lumbar spine. The quality of the pain is described as aching, cramping and burning. The pain radiates to the right thigh and left thigh. The pain is severe. The pain is the same all the time. The symptoms are aggravated by bending, lying down, position, sitting and twisting. Stiffness is present all day. Associated symptoms include leg pain. Pertinent negatives include no abdominal pain, bladder incontinence, bowel incontinence, chest pain, dysuria, fever, headaches, numbness, paresis, paresthesias, pelvic pain, perianal numbness, tingling or weakness. She has tried NSAIDs for the symptoms. The treatment provided no relief.   RN Note: Pt c/o pain in her lower back that radiated down on both legs since Tuesday.  Past Medical History: Past Medical History:  Diagnosis Date  . Asthma     Past obstetric history: OB History  Gravida Para Term Preterm AB Living  5 3 3  0 1 3  SAB TAB Ectopic Multiple Live Births  1     0 3    # Outcome Date GA Lbr Len/2nd Weight Sex Delivery Anes PTL Lv  5 Current           4 Term 11/04/15 6255w6d 01:05 / 00:05 6 lb 12.3 oz (3.07 kg) F Vag-Spont None  LIV  3 SAB  6848w0d         2 Term      Vag-Spont   LIV  1 Term      Vag-Spont   LIV      Past Surgical History: Past  Surgical History:  Procedure Laterality Date  . NO PAST SURGERIES      Family History: No family history on file.  Social History: Social History  Substance Use Topics  . Smoking status: Never Smoker  . Smokeless tobacco: Never Used  . Alcohol use No    Allergies:  Allergies  Allergen Reactions  . Penicillins Other (See Comments)    Body goes to sleep Has patient had a PCN reaction causing immediate rash, facial/tongue/throat swelling, SOB or lightheadedness with hypotension: Yes Has patient had a PCN reaction causing severe rash involving mucus membranes or skin necrosis: No Has patient had a PCN reaction that required hospitalization No Has patient had a PCN reaction occurring within the last 10 years: No If all of the above answers are "NO", then may proceed with Cephalosporin use.    Meds:  Prescriptions Prior to Admission  Medication Sig Dispense Refill Last Dose  . ibuprofen (ADVIL,MOTRIN) 600 MG tablet Take 1 tablet (600 mg total) by mouth every 6 (six) hours as needed for moderate pain or cramping (Do not use after [redacted] weeks gestation). 30 tablet 0   . Prenat w/o A Vit-FeFum-FePo-FA (CONCEPT OB) 130-92.4-1 MG CAPS Take 1 tablet by mouth daily. 30 capsule 12     I have reviewed patient's Past Medical  Hx, Surgical Hx, Family Hx, Social Hx, medications and allergies.   ROS:  Review of Systems  Constitutional: Negative for fever.  Cardiovascular: Negative for chest pain.  Gastrointestinal: Negative for abdominal pain and bowel incontinence.  Genitourinary: Negative for bladder incontinence, dysuria and pelvic pain.  Musculoskeletal: Positive for back pain.  Neurological: Negative for tingling, weakness, numbness, headaches and paresthesias.   Other systems negative  Physical Exam  Patient Vitals for the past 24 hrs:  BP Temp Temp src Pulse Resp Weight  07/02/16 1419 107/64 97.8 F (36.6 C) Oral 68 18 181 lb (82.1 kg)   Constitutional: Well-developed,  well-nourished female in no acute distress, uncomfortable appearing.  Cardiovascular: normal rate and rhythm Respiratory: normal effort, clear to auscultation bilaterally GI: Abd soft, non-tender, gravid appropriate for gestational age.   No rebound or guarding. MS: Extremities nontender, no edema, normal ROM    Low back tender to palpation from iliac crest to iliac crest, primarily lower lumbar and upper sacral areas. No acute spasm but hurts more with movement.  Neurologic: Alert and oriented x 4.  GU: Neg CVAT.  PELVIC EXAM:  deferred due to inabilty to move and no presence of contractions.  FHR 154  Labs: Results for orders placed or performed during the hospital encounter of 07/02/16 (from the past 24 hour(s))  Urinalysis, Routine w reflex microscopic     Status: Abnormal   Collection Time: 07/02/16  2:03 PM  Result Value Ref Range   Color, Urine YELLOW YELLOW   APPearance HAZY (A) CLEAR   Specific Gravity, Urine 1.021 1.005 - 1.030   pH 6.0 5.0 - 8.0   Glucose, UA NEGATIVE NEGATIVE mg/dL   Hgb urine dipstick NEGATIVE NEGATIVE   Bilirubin Urine NEGATIVE NEGATIVE   Ketones, ur 20 (A) NEGATIVE mg/dL   Protein, ur NEGATIVE NEGATIVE mg/dL   Nitrite NEGATIVE NEGATIVE   Leukocytes, UA NEGATIVE NEGATIVE    Imaging:  [place] Mfm Ob Limited  Result Date: 06/24/2016 OBSTETRICAL ULTRASOUND: This exam was performed within a [facility]. The OB US report was generated in the AS system, and faxed to the ordering physician.  This report is available in the [facility] [employer] PACS. See the [facility] Obstetric US report via the Image Link.   MAU Course/MDM: I have ordered labs and reviewed results.  No evidence of urinary infection or stones.  Consult Dr Vergie LivingPickens with presentation, exam findings and test results.  Treatments in MAU included Flexeril with "a little" relief..    Assessment: SIUP at 6262w2d Low back pain, likely strain/spasm, recurrent No evidence of urinary source of  pain  Plan: Discharge home Rx Flexeril for use q8h prn.  Rx #3 tabs Percocet. Warned not to use within 8 hours of Flexeril Rest as possible Preterm Labor precautions and fetal kick counts Follow up in clinic on Monday for prenatal visits and recheck of status  Encouraged to return here or to other Urgent Care/ED if she develops worsening of symptoms, increase in pain, fever, or other concerning symptoms.   Pt stable at time of discharge.  Wynelle BourgeoisMarie Khloi Rawl CNM, MSN Certified Nurse-Midwife 07/02/2016 3:18 PM

## 2016-07-02 NOTE — MAU Note (Signed)
Pt c/o pain in her lower back that radiated down on both legs since Tuesday.

## 2016-07-03 MED FILL — ?IBUPROFEN 600 MG TABLET: 600 MG | 7 days supply | Qty: 30 | Fill #0

## 2016-07-03 MED FILL — CONCEPT OB CAPSULE: 130-92.4-1 | 30 days supply | Qty: 30 | Fill #0

## 2016-07-27 NOTE — L&D Delivery Note (Signed)
Delivery Note At 5:13 AM a viable female was delivered via  (Presentation vertex:LOA ;  ).  APGAR:9 ,9 ; weight  .   Placenta status spont: ,shultz .  Cord:3vc  with the following complications: none .  Cord pH: n/a  Anesthesia:  none Episiotomy:  none Lacerations:  none Suture Repair: n/a Est. Blood Loss 100(mL):    Mom to postpartum.  Baby to Couplet care / Skin to Skin.  Wyvonnia Dusky 11/16/2016, 5:26 AM

## 2016-11-16 ENCOUNTER — Inpatient Hospital Stay (HOSPITAL_COMMUNITY)
Admission: AD | Admit: 2016-11-16 | Discharge: 2016-11-17 | DRG: 775 | Disposition: A | Discharge status: Home / Self Care | Payer: Medicaid Other | Source: Ambulatory Visit | Attending: Obstetrics & Gynecology | Admitting: Obstetrics & Gynecology

## 2016-11-16 ENCOUNTER — Encounter (HOSPITAL_COMMUNITY): Payer: Self-pay | Admitting: Certified Nurse Midwife

## 2016-11-16 DIAGNOSIS — Z3A38 38 weeks gestation of pregnancy: Secondary | ICD-10-CM

## 2016-11-16 DIAGNOSIS — O4202 Full-term premature rupture of membranes, onset of labor within 24 hours of rupture: Secondary | ICD-10-CM | POA: Diagnosis present

## 2016-11-16 DIAGNOSIS — Z880 Allergy status to penicillin: Secondary | ICD-10-CM | POA: Diagnosis not present

## 2016-11-16 DIAGNOSIS — Z3480 Encounter for supervision of other normal pregnancy, unspecified trimester: Secondary | ICD-10-CM

## 2016-11-16 LAB — CBC
HCT: 36.1 % (ref 36.0–46.0)
Hemoglobin: 12.2 g/dL (ref 12.0–15.0)
MCH: 28.1 pg (ref 26.0–34.0)
MCHC: 33.8 g/dL (ref 30.0–36.0)
MCV: 83.2 fL (ref 78.0–100.0)
Platelets: 156 10*3/uL (ref 150–400)
RBC: 4.34 MIL/uL (ref 3.87–5.11)
RDW: 13.4 % (ref 11.5–15.5)
WBC: 7.7 10*3/uL (ref 4.0–10.5)

## 2016-11-16 LAB — TYPE AND SCREEN
ABO/RH(D): A POS
ANTIBODY SCREEN: NEGATIVE

## 2016-11-16 LAB — RPR: RPR Ser Ql: NONREACTIVE

## 2016-11-16 MED ORDER — SENNOSIDES-DOCUSATE SODIUM 8.6-50 MG PO TABS
2.0000 | ORAL_TABLET | ORAL | Status: DC
  Administered 2016-11-16: 2 via ORAL
  Filled 2016-11-16: qty 2

## 2016-11-16 MED ORDER — OXYCODONE-ACETAMINOPHEN 5-325 MG PO TABS: 2.0000 | ORAL_TABLET | ORAL | Status: DC | PRN

## 2016-11-16 MED ORDER — ONDANSETRON HCL 4 MG PO TABS: 4.0000 mg | ORAL_TABLET | ORAL | Status: DC | PRN

## 2016-11-16 MED ORDER — DIPHENHYDRAMINE HCL 25 MG PO CAPS: 25.0000 mg | ORAL_CAPSULE | Freq: Four times a day (QID) | ORAL | Status: DC | PRN

## 2016-11-16 MED ORDER — BENZOCAINE-MENTHOL 20-0.5 % EX AERO: 1.0000 "application " | INHALATION_SPRAY | CUTANEOUS | Status: DC | PRN

## 2016-11-16 MED ORDER — SODIUM CHLORIDE 0.9% FLUSH: 3.0000 mL | INTRAVENOUS | Status: DC | PRN

## 2016-11-16 MED ORDER — OXYCODONE-ACETAMINOPHEN 5-325 MG PO TABS
1.0000 | ORAL_TABLET | Freq: Four times a day (QID) | ORAL | Status: DC | PRN
  Administered 2016-11-16: 1 via ORAL
  Filled 2016-11-16: qty 1

## 2016-11-16 MED ORDER — OXYCODONE-ACETAMINOPHEN 5-325 MG PO TABS
1.0000 | ORAL_TABLET | ORAL | Status: DC | PRN
  Administered 2016-11-16: 1 via ORAL
  Filled 2016-11-16: qty 1

## 2016-11-16 MED ORDER — ACETAMINOPHEN 325 MG PO TABS: 650.0000 mg | ORAL_TABLET | ORAL | Status: DC | PRN

## 2016-11-16 MED ORDER — TETANUS-DIPHTH-ACELL PERTUSSIS 5-2.5-18.5 LF-MCG/0.5 IM SUSP: 0.5000 mL | Freq: Once | INTRAMUSCULAR | Status: DC

## 2016-11-16 MED ORDER — DIBUCAINE 1 % RE OINT: 1.0000 "application " | TOPICAL_OINTMENT | RECTAL | Status: DC | PRN

## 2016-11-16 MED ORDER — SOD CITRATE-CITRIC ACID 500-334 MG/5ML PO SOLN: 30.0000 mL | ORAL | Status: DC | PRN

## 2016-11-16 MED ORDER — MEASLES, MUMPS & RUBELLA VAC ~~LOC~~ INJ
0.5000 mL | INJECTION | Freq: Once | SUBCUTANEOUS | Status: DC
  Filled 2016-11-16: qty 0.5

## 2016-11-16 MED ORDER — OXYTOCIN 10 UNIT/ML IJ SOLN
INTRAMUSCULAR | Status: AC
  Administered 2016-11-16: 10 [IU]
  Filled 2016-11-16: qty 1

## 2016-11-16 MED ORDER — ONDANSETRON HCL 4 MG/2ML IJ SOLN: 4.0000 mg | INTRAMUSCULAR | Status: DC | PRN

## 2016-11-16 MED ORDER — PRENATAL MULTIVITAMIN CH
1.0000 | ORAL_TABLET | Freq: Every day | ORAL | Status: DC
  Administered 2016-11-16 – 2016-11-17 (×2): 1 via ORAL
  Filled 2016-11-16 (×2): qty 1

## 2016-11-16 MED ORDER — FENTANYL CITRATE (PF) 100 MCG/2ML IJ SOLN: 100.0000 ug | INTRAMUSCULAR | Status: DC | PRN

## 2016-11-16 MED ORDER — SODIUM CHLORIDE 0.9 % IV SOLN: 250.0000 mL | INTRAVENOUS | Status: DC | PRN

## 2016-11-16 MED ORDER — WITCH HAZEL-GLYCERIN EX PADS: 1.0000 "application " | MEDICATED_PAD | CUTANEOUS | Status: DC | PRN

## 2016-11-16 MED ORDER — IBUPROFEN 600 MG PO TABS
600.0000 mg | ORAL_TABLET | Freq: Four times a day (QID) | ORAL | Status: DC
  Administered 2016-11-16 – 2016-11-17 (×5): 600 mg via ORAL
  Filled 2016-11-16 (×5): qty 1

## 2016-11-16 MED ORDER — ONDANSETRON HCL 4 MG/2ML IJ SOLN: 4.0000 mg | Freq: Four times a day (QID) | INTRAMUSCULAR | Status: DC | PRN

## 2016-11-16 MED ORDER — SIMETHICONE 80 MG PO CHEW: 80.0000 mg | CHEWABLE_TABLET | ORAL | Status: DC | PRN

## 2016-11-16 MED ORDER — OXYTOCIN 40 UNITS IN LACTATED RINGERS INFUSION - SIMPLE MED: 2.5000 [IU]/h | INTRAVENOUS | Status: DC

## 2016-11-16 MED ORDER — SODIUM CHLORIDE 0.9% FLUSH: 3.0000 mL | Freq: Two times a day (BID) | INTRAVENOUS | Status: DC

## 2016-11-16 MED ORDER — LIDOCAINE HCL (PF) 1 % IJ SOLN: 30.0000 mL | INTRAMUSCULAR | Status: DC | PRN

## 2016-11-16 MED ORDER — OXYTOCIN BOLUS FROM INFUSION: 500.0000 mL | Freq: Once | INTRAVENOUS | Status: DC

## 2016-11-16 MED ORDER — LACTATED RINGERS IV SOLN: INTRAVENOUS | Status: DC

## 2016-11-16 MED ORDER — LACTATED RINGERS IV SOLN: 500.0000 mL | INTRAVENOUS | Status: DC | PRN

## 2016-11-16 MED ORDER — COCONUT OIL OIL: 1.0000 "application " | TOPICAL_OIL | Status: DC | PRN

## 2016-11-16 MED ORDER — ZOLPIDEM TARTRATE 5 MG PO TABS: 5.0000 mg | ORAL_TABLET | Freq: Every evening | ORAL | Status: DC | PRN

## 2016-11-16 NOTE — Lactation Note (Signed)
This note was copied from a baby's chart. Lactation Consultation Note: Initial visit with mom. She states she speaks a little [place]- offered interpreter and mom said she was ok.  Experienced BF mom. Has baby latched to breast when I went into room. Reports no pain with latch.  Spanish BF brochure given- reviewed our phone number.  Encouraged to feed with feeding cues. No questions at present. To call prn  Patient Name: Karla Reynolds [id number] Date: 11/16/2016 Reason for consult: Initial assessment   Maternal Data Formula Feeding for Exclusion: No Does the patient have breastfeeding experience prior to this delivery?: Yes  Feeding  LATCH Score/Interventions Latch: Grasps breast easily, tongue down, lips flanged, rhythmical sucking.  Audible Swallowing: A few with stimulation  Type of Nipple: Everted at rest and after stimulation  Comfort (Breast/Nipple): Soft / non-tender     Hold (Positioning): No assistance needed to correctly position infant at breast.  LATCH Score: 9  Lactation Tools Discussed/Used     Consult Status Consult Status: Follow-up Date: 11/17/16 Follow-up type: In-patient    Pamelia Hoit 11/16/2016, 1:36 PM

## 2016-11-16 NOTE — MAU Note (Signed)
Patient presents to MAU with c/o of contractions every 3 minutes. Contractions began around 0300 and has gotten stronger since then. Denies VB. States her water might have broke, steady trickle of fluid since contractions began. +FM

## 2016-11-16 NOTE — H&P (Signed)
LABOR AND DELIVERY ADMISSION HISTORY AND PHYSICAL NOTE  Karla Reynolds is a 36 y.o. female [id number] with IUP at 103w6d by LMP presenting for SOL s/p SROM. Patient had routine prenatal care at the [facility] health department. Patient was 6 cm dilated upon presentation to the MAU. Patient had SROM at home around 0300. She endorses good fetal movement and contractions every 3 min upon admission to MAU.   She reports positive fetal movement. She denies leakage of fluid or vaginal bleeding.  Prenatal History/Complications:  Past Medical History: Past Medical History:  Diagnosis Date  . Asthma     Past Surgical History: Past Surgical History:  Procedure Laterality Date  . NO PAST SURGERIES      Obstetrical History: OB History    Gravida Para Term Preterm AB Living   0 1 3   SAB TAB Ectopic Multiple Live Births   1     0 3      Social History: Social History   Social History  . Marital status: Single    Spouse name: N/A  . Number of children: N/A  . Years of education: N/A   Social History Main Topics  . Smoking status: Never Smoker  . Smokeless tobacco: Never Used  . Alcohol use No  . Drug use: No  . Sexual activity: Not on file   Other Topics Concern  . Not on file   Social History Narrative  . No narrative on file    Family History: No family history on file.  Allergies: Allergies  Allergen Reactions  . Penicillins Other (See Comments)    Body goes to sleep Has patient had a PCN reaction causing immediate rash, facial/tongue/throat swelling, SOB or lightheadedness with hypotension: Yes Has patient had a PCN reaction causing severe rash involving mucus membranes or skin necrosis: No Has patient had a PCN reaction that required hospitalization No Has patient had a PCN reaction occurring within the last 10 years: No If all of the above answers are "NO", then may proceed with Cephalosporin use.    Prescriptions Prior to Admission   Medication Sig Dispense Refill Last Dose  . cyclobenzaprine (FLEXERIL) 10 MG tablet Take 1 tablet (10 mg total) by mouth 3 (three) times daily as needed for muscle spasms. Do not take within 8 hours of Percocet 20 tablet 0   . ibuprofen (ADVIL,MOTRIN) 600 MG tablet Take 1 tablet (600 mg total) by mouth every 6 (six) hours as needed for moderate pain or cramping (Do not use after [redacted] weeks gestation). 30 tablet 0 07/02/2016 at Unknown time  . oxyCODONE-acetaminophen (PERCOCET/ROXICET) 5-325 MG tablet Take 1-2 tablets by mouth every 8 (eight) hours as needed. Do not take within 8 hours of flexeril 3 tablet 0   . Prenat w/o A Vit-FeFum-FePo-FA (CONCEPT OB) 130-92.4-1 MG CAPS Take 1 tablet by mouth daily. 30 capsule 12 07/02/2016 at Unknown time     Review of Systems   All systems reviewed and negative except as stated in HPI  Weight 193 lb (87.5 kg), last menstrual period 02/18/2016, unknown if currently breastfeeding. General appearance: alert, cooperative and appears stated age Lungs: Normal respiratory effort, no audible wheezing Heart: regular rate and pulses palpated bilaterally  Abdomen: soft, non-tender; gravid abdomen appropriate for age. Extremities: No calf swelling or tenderness Presentation: cephalic by nurse exam Fetal monitoring: N/A patient delivered promptly upon admission to the LD floor Uterine activity: N/A see above     Prenatal labs: ABO, Rh:  A positive Antibody:  Negative Rubella: Immune RPR:  Negative HBsAg:  Negative HIV: Non Reactive (11/29 0203)  GBS:   negative  1 hr Glucola: 107 Genetic screening: Normal Anatomy US: Normal  Prenatal Transfer Tool  Maternal Diabetes: No Genetic Screening: Abnormal:  Results: Other: late  Maternal Ultrasounds/Referrals: Normal Fetal Ultrasounds or other Referrals:  None Maternal Substance Abuse:  No Significant Maternal Medications:  None Significant Maternal Lab Results: None  No results found for this or any  previous visit (from the past 24 hour(s)).  Patient Active Problem List   Diagnosis Date Noted  . Normal labor 11/16/2016  . Supervision of normal pregnancy, antepartum 08/15/2015  . Itching 08/15/2015    Assessment: Karla Reynolds is a 36 y.o. [id number] at [redacted]w[redacted]d here for SOL s/p SROM. Patient had a precipitous delivery upon admission to L&D. Patient was stable after delivery with minimal blood loss and vitals within normal limit.  #Labor: Expectant management with precipitous phase 2 of labor #Pain: No epidural #FWB: No tracing prior to delivery #ID:  GBS neg  #MOF: Breast and Bottle #MOC: Nexplanon #Circ:  Declined  Abdoulaye Diallo, PGY-1 11/16/2016, 5:05 AM

## 2016-11-17 ENCOUNTER — Encounter (HOSPITAL_COMMUNITY): Payer: Self-pay | Admitting: *Deleted

## 2016-11-17 MED ORDER — IBUPROFEN 600 MG PO TABS: 600.0000 mg | ORAL_TABLET | Freq: Four times a day (QID) | ORAL | 0 refills | Status: DC | PRN

## 2016-11-17 NOTE — Discharge Summary (Signed)
OB Discharge Summary     Patient Name: Karla Reynolds DOB: 06-04-1981 MRN: [id number]  Date of admission: 11/16/2016 Delivering MD: Lovena Neighbours   Date of discharge: 11/17/2016  Admitting diagnosis: 40 WEEKS CTX Intrauterine pregnancy: 107w6d     Secondary diagnosis:  Active Problems:   Normal labor  Additional problems: none     Discharge diagnosis: Term Pregnancy Delivered                                                                                                Post partum procedures:none  Augmentation: none  Complications: None  Hospital course:  Onset of Labor With Vaginal Delivery     36 y.o. yo [id number] at [redacted]w[redacted]d was admitted in Active Labor on 11/16/2016. Patient had an uncomplicated labor course as follows:  Membrane Rupture Time/Date: 3:00 AM ,11/16/2016   Intrapartum Procedures: Episiotomy: None [1]                                         Lacerations:  None [1]  Patient had a delivery of a Viable infant. 11/16/2016  Information for the patient's newborn:  Aarion, Kittrell Boy Estelle [[id number]]  Delivery Method: Vag-Spont    Pateint had an uncomplicated postpartum course.  She is ambulating, tolerating a regular diet, passing flatus, and urinating well. Patient is discharged home in stable condition on 11/17/16.   Physical exam  Vitals:   11/16/16 1153 11/16/16 1836 11/16/16 1921 11/17/16 0508  BP: 127/71 110/72 (!) 119/57 108/70  Pulse: 71 69 65 67  Resp: Temp:  97.8 F (36.6 C) 97.5 F (36.4 C) 98 F (36.7 C)  TempSrc:  Oral Oral Oral  SpO2:  100%    Weight:      Height:       General: alert, cooperative and no distress Lochia: appropriate Uterine Fundus: firm Incision: N/A DVT Evaluation: No evidence of DVT seen on physical exam. Negative Homan's sign. No cords or calf tenderness. No significant calf/ankle edema. Labs: Lab Results  Component Value Date   WBC 7.7 11/16/2016   HGB 12.2 11/16/2016   HCT 36.1  11/16/2016   MCV 83.2 11/16/2016   PLT 156 11/16/2016   CMP Latest Ref Rng & Units 02/26/2016  Glucose 65 - 99 mg/dL 82  BUN 7 - 25 mg/dL 18  Creatinine 5.36 - 6.44 mg/dL 0.34  Sodium 742 - 595 mmol/L 139  Potassium 3.5 - 5.3 mmol/L 4.2  Chloride 98 - 110 mmol/L 106  CO2 20 - 31 mmol/L 23  Calcium 8.6 - 10.2 mg/dL 8.9  Total Protein 6.5 - 8.1 g/dL -  Total Bilirubin 0.3 - 1.2 mg/dL -  Alkaline Phos 38 - 638 U/L -  AST 15 - 41 U/L -  ALT 14 - 54 U/L -    Discharge instruction: per After Visit Summary and "Baby and Me Booklet".  After visit meds:  Allergies as of 11/17/2016      Reactions   Penicillins Other (  See Comments)   Body goes to sleep Has patient had a PCN reaction causing immediate rash, facial/tongue/throat swelling, SOB or lightheadedness with hypotension: Yes Has patient had a PCN reaction causing severe rash involving mucus membranes or skin necrosis: No Has patient had a PCN reaction that required hospitalization No Has patient had a PCN reaction occurring within the last 10 years: No If all of the above answers are "NO", then may proceed with Cephalosporin use.      Medication List    STOP taking these medications   oxyCODONE-acetaminophen 5-325 MG tablet Commonly known as:  PERCOCET/ROXICET     TAKE these medications   CONCEPT OB 130-92.4-1 MG Caps Take 1 tablet by mouth daily.   ibuprofen 600 MG tablet Commonly known as:  ADVIL,MOTRIN Take 1 tablet (600 mg total) by mouth every 6 (six) hours as needed for moderate pain or cramping (Do not use after [redacted] weeks gestation).       Diet: routine diet  Activity: Advance as tolerated. Pelvic rest for 6 weeks.   Outpatient follow up:6 weeks Follow up Appt:No future appointments. Follow up Visit:No Follow-up on file.  Postpartum contraception: Nexplanon  Newborn Data: Live born female  Birth Weight: 6 lb 10 oz (3005 g) APGAR: 9,   Baby Feeding: Bottle and Breast Disposition:home with  mother   11/17/2016 Donette Larry, CNM

## 2016-11-17 NOTE — Lactation Note (Signed)
This note was copied from a baby's chart. Lactation Consultation Note Spanish speaking mom, interpreter present for consult. Karla Reynolds. Experienced BF mom BF for 3 yrs to one child and 20 months to another child. Mom is breast and formula, but mainly plans to BF. Mom is mainly supplementing now until her milk comes in. Encouraged to BF first then supplement as needed. Discussed supply and demand.] Discussed engorgement, prevention, mastitis, clogged ducts, I&O, pumping, milk storage storage, and building storage supply.  Demonstrated hand expression. Mom stated she has some pain when baby suckles on breast from top to bottom. Assessed breast, no knots felts. Educated on ducts filling, massage for release, let down sensation, and notifying MD if has questions or concerns.  Mom doesn't have WIC. [facility] and sheet w/community information of outside resources.   Patient Name: Karla Reynolds [id number] Date: 11/17/2016 Reason for consult: Follow-up assessment   Maternal Data    Feeding Feeding Type: Formula Nipple Type: Slow - flow Length of feed: 15 min  LATCH Score/Interventions                      Lactation Tools Discussed/Used Pump Review: Setup, frequency, and cleaning;Milk Storage Initiated by:: Karla Jefferson RN IBCLC Date initiated:: 11/17/16   Consult Status Consult Status: Complete Date: 11/17/16    Karla Reynolds 11/17/2016, 11:53 AM

## 2017-11-08 ENCOUNTER — Ambulatory Visit: Payer: Self-pay | Admitting: Allergy and Immunology

## 2018-08-25 ENCOUNTER — Ambulatory Visit (INDEPENDENT_AMBULATORY_CARE_PROVIDER_SITE_OTHER): Payer: Self-pay | Admitting: Family Medicine

## 2018-08-25 ENCOUNTER — Encounter: Payer: Self-pay | Admitting: Family Medicine

## 2018-08-25 VITALS — BP 105/72 | HR 76 | Resp 17 | Ht 64.0 in | Wt 189.4 lb

## 2018-08-25 DIAGNOSIS — F331 Major depressive disorder, recurrent, moderate: Secondary | ICD-10-CM

## 2018-08-25 DIAGNOSIS — R632 Polyphagia: Secondary | ICD-10-CM

## 2018-08-25 MED ORDER — BUPROPION HCL ER (XL) 150 MG PO TB24: 150.0000 mg | ORAL_TABLET | Freq: Every day | ORAL | 3 refills | Status: DC

## 2018-08-25 MED FILL — BUPROPION HCL XL 150 MG TAB: 150 | 30 days supply | Qty: 30 | Fill #0

## 2018-08-25 NOTE — Patient Instructions (Signed)
Karl PockGracias por elegir [facility] [place] para su hogar mdico!    Karla Reynolds fue visto por Karla CourtsKimberly Kseniya Grunden, FNP hoy.   El mdico de atencin primaria de Karla Reynolds es Karla Reynolds, [place] SultanKimberly Reynolds, [place].   Para la mejor atencin posible, usted debe tratar de ver A Karla CourtsKimberly Hassani Sliney, FNP-C  cada vez que vienes a la clnica.   Esperamos volver a verte pronto!  Si tiene The Mutual of Omahaalguna pregunta sobre su visita de hoy,  llmenos al [phone]  O no dude en comunicarse con su proveedor a Sonic Automotivetravs de MyChart.      Bupropion tablets (Depression/Mood Disorders) O que  este medicamento? A BUPROPIONA  usada no tratamento da depresso. Este medicamento pode ser usado para outros propsitos; em caso de dvidas, pergunte ao seu profissional de sade ou farmacutico. NOMES DE MARCAS COMUNS: Wellbutrin O que devo dizer a meu profissional de sade antes de tomar este medicamento? Precisam saber se voc tem algum dos seguintes problemas ou estados de sade: -transtornos alimentares (por exemplo, anorexia ou bulimia) -transtorno bipolar ou psicose -diabetes ou glicemia alta tratada com medicamentos -glaucoma -histria pregressa de insuficincia cardaca, ataque do corao ou outra doena cardaca -batimento cardaco irregular -traumatismo craniano ou tumor no crebro -presso alta -doenas renais ou hepticas -convulses (crises convulsivas) -pensamentos suicidas ou planos de cometer suicdio -tentativa de suicdio (sua ou de um parente) -sndrome de Tourette -perda de peso -reao estranha ou alergia  bupropiona -reao estranha ou alergia a outros medicamentos -reao estranha ou alergia a alimentos, corantes ou conservantes -est amamentando -est grvida ou tentando Pilgrim'Reynolds Prideengravidar Como devo usar este medicamento? Tome este medicamento por via oral com um copo d'gua. Siga as instrues na embalagem ou na bula. Voc pode tomar este medicamento com ou sem comida. Se este  medicamento lhe fizer mal ao estmago, tome-o com comida. Tome este medicamento em intervalos regulares. No tome este medicamento com frequncia maior do que a indicada. No pare de tomar PPL Corporationeste medicamento subitamente, exceto sob aconselhamento mdico. Interromper o uso desta medicao muito rapidamente pode causar efeitos secundrios graves ou agravar a sua condio. O farmacutico lhe dar um folheto informativo especial a cada compra do medicamento. No se esquea de ler atentamente essas informaes todas as vezes. Fale com seu pediatra a respeito do uso deste medicamento em crianas. Pode ser preciso tomar alguns cuidados especiais. Superdosagem: Se achar que tomou uma superdosagem deste medicamento, entre em contato imediatamente com o [facility] Barrettont[facility] de Intoxicaes ou v a Health Netum pronto-socorro. OBSERVAO: Este medicamento  Reynolds para voc. No compartilhe este medicamento com outras pessoas. E se eu deixar de tomar uma dose? Se perder uma dose, tome-a assim que possvel. Faltando menos de 4 horas para a sua prxima dose, tome somente essa dose e pule a dose perdida. No tome o remdio em dobro, nem tome uma dose adicional. O que pode interagir com este medicamento? No tome este medicamento com nenhum dos seguintes: -linezolida -alguns medicamentos chamados inibidores da MAO, como Azilect, Springviewarbex, Eldepryl, [place]plan, Nardil e Parnate -azul de metileno (injetado na veia) -outros medicamentos que contenham bupropiona, como o Zyban Este medicamento tambm pode interagir com os seguintes remdios: -lcool -alguns medicamentos para ansiedade ou problemas de sono -alguns medicamentos para hipertenso, como metoprolol e propranolol -alguns medicamentos para depresso ou transtornos psicticos -alguns medicamentos contra a infeco pelo HIV ou AIDS, como efiravenz, lopinavir, nelfinavir, ritonavir -alguns medicamentos para controlar o ritmo cardaco, como flecainida ou propafenona -alguns  medicamentos para mal de Parkinson, como amantadina, levodopa -alguns  medicamentos para crises convulsivas, como carbamazepina, fenobarbital, fenitona -cimetidina -clopidogrel -ciclofosfamida -digoxina -furazolidona -isoniazida -nicotina -orfenadrina -procarbazina -corticoides, como prednisona ou cortisona -medicamentos estimulantes para transtornos de ateno, para perder peso ou para permanecer acordado -tamoxifeno -teofilina -tiotepa -ticlopidina -tramadol -varfarina Esta lista pode no descrever todas as interaes possveis. D ao seu profissional de sade uma lista de todos os medicamentos, ervas medicinais, remdios de venda livre, ou suplementos alimentares que voc [place]. Diga tambm se voc fuma, bebe, ou [place] drogas ilcitas. Alguns destes podem interagir com o seu medicamento. Ao que devo ficar atento quando estiver [place] medicamento? Avise seu mdico ou profissional de sade se os seus sintomas no melhorarem ou se piorarem. Consulte seu mdico ou profissional de sade para realizar um acompanhamento regular Education officer, museum. Uma vez que pode levar vrias semanas para sentir os efeitos deste medicamento,  importante continuar o tratamento conforme prescrito pelo seu mdico. Os pacientes e seus familiares devem ficar atentos a uma possvel recorrncia ou piora da depresso ou pensamentos suicidas. Tambm devem ficar atentos a qualquer Mirant ou grave nas sensaes do paciente, como ansiedade, [place], [place], [place], agressividade, impulsividade, forte inquietao, excitao excessiva, hiperatividade ou insnia. Se isso acontecer, principalmente no comeo do tratamento ou aps uma mudana de dose, entre em contato com seu mdico ou profissional de sade. Evite bebidas alcolicas enquanto estiver [employer] Edison. Consumir bebidas alcolicas em excesso, usar sonferos ou medicamentos para ansiedade, ou parar o uso desses medicamentos de repente Technical sales engineer tomando este medicamento pode aumentar o risco de convulso. No dirija, no opere mquinas e no faa nada que exija concentrao mental at [employer] como o medicamento lhe afeta. Este medicamento pode prejudicar a sua capacidade para realizar essas tarefas. No tome este medicamento perto da hora de Camera operator. Ele pode lhe impedir de dormir. Voc pode ficar com a boca seca. Mascar chiclete sem acar ou chupar balas, alm de beber bastante gua, pode ajudar. Entre em contato com seu mdico se o problema Science writer. Que efeitos colaterais posso sentir aps usar este medicamento? Efeitos colaterais que devem ser informados ao seu mdico ou profissional de sade o mais rpido possvel: -reaes alrgicas, como erupo na pele, coceira, urticria, ou inchao do rosto, dos lbios ou da lngua -dificuldade para respirar -alteraes na viso -confuso -humor elevado, diminuio da necessidade de sono, pensamentos descontrolados, comportamento impulsivo -batimento cardaco acelerado ou irregular -alucinaes, perda do contato com a realidade -aumento da presso arterial -vermelhido, bolhas, descamao ou afrouxamento da pele, inclusive dentro da boca -convulses (crises convulsivas) -pensamentos suicidas ou outras alteraes do humor -fraqueza ou cansao fora do comum -vmitos Efeitos colaterais que normalmente no precisam de cuidados mdicos (avise ao seu mdico ou profissional de sade se persistirem ou forem incmodos): -priso de ventre -dor de cabea -perda de apetite -enjoo -tremores -perda de peso Esta lista pode no descrever todos os efeitos colaterais possveis. Para mais orientaes sobre efeitos colaterais, consulte o seu mdico. Voc pode relatar a ocorrncia de efeitos colaterais  FDA pelo telefone [phone]. Onde devo guardar meu medicamento? Mal Misty fora do [employer]. Conservar em temperatura ambiente, entre 20 e 25 degreesC [phone] e 77  degreesF). Gaffer luz solar direta e da [place]. Manter o recipiente bem fechado. Descartar qualquer medicamento no utilizado aps a data de validade impressa no rtulo ou embalagem. OBSERVAO: Este folheto  um resumo. Pode no cobrir todas as informaes possveis. Se tiver dvidas a respeito deste medicamento, fale com seu mdico, farmacutico  ou profissional de sade.  2019 Elsevier/Gold Standard (2016-08-13 00:00:00)
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
## 2018-08-26 LAB — COMPREHENSIVE METABOLIC PANEL
ALT: 17 IU/L (ref 0–32)
AST: 15 IU/L (ref 0–40)
Albumin/Globulin Ratio: 1.5 (ref 1.2–2.2)
Albumin: 4.1 g/dL (ref 3.8–4.8)
Alkaline Phosphatase: 88 IU/L (ref 39–117)
BUN/Creatinine Ratio: 19 (ref 9–23)
BUN: 11 mg/dL (ref 6–20)
Bilirubin Total: <0.2 mg/dL (ref 0.0–1.2)
CO2: 22 mmol/L (ref 20–29)
CREATININE: 0.59 mg/dL (ref 0.57–1.00)
Calcium: 9.6 mg/dL (ref 8.7–10.2)
Chloride: 102 mmol/L (ref 96–106)
GFR calc Af Amer: 137 mL/min/{1.73_m2} (ref >59)
GFR calc non Af Amer: 119 mL/min/{1.73_m2} (ref >59)
GLOBULIN, TOTAL: 2.8 g/dL (ref 1.5–4.5)
Glucose: 84 mg/dL (ref 65–99)
Potassium: 4.6 mmol/L (ref 3.5–5.2)
SODIUM: 140 mmol/L (ref 134–144)
Total Protein: 6.9 g/dL (ref 6.0–8.5)

## 2018-08-26 LAB — HEMOGLOBIN A1C
Est. average glucose Bld gHb Est-mCnc: 111 mg/dL
Hgb A1c MFr Bld: 5.5 % (ref 4.8–5.6)

## 2018-08-26 LAB — TSH: TSH: 1.64 u[IU]/mL (ref 0.450–4.500)

## 2018-09-06 ENCOUNTER — Ambulatory Visit (INDEPENDENT_AMBULATORY_CARE_PROVIDER_SITE_OTHER): Payer: Self-pay | Admitting: Licensed Clinical Social Worker

## 2018-09-06 DIAGNOSIS — F331 Major depressive disorder, recurrent, moderate: Secondary | ICD-10-CM

## 2018-09-06 DIAGNOSIS — F419 Anxiety disorder, unspecified: Secondary | ICD-10-CM

## 2018-09-06 NOTE — Progress Notes (Signed)
Patient notified of results. Expressed understanding.

## 2018-09-09 ENCOUNTER — Telehealth: Payer: Self-pay | Admitting: Licensed Clinical Social Worker

## 2018-09-09 NOTE — Telephone Encounter (Signed)
LCSWA utilized [employer] Wynona Canes VO#[phone]) to call patient to follow up on behavioral health resources, in addition, to Legal Aid referral. A message was left for a return call.

## 2018-09-09 NOTE — BH Specialist Note (Signed)
Integrated Behavioral Health Initial Visit  MRN: [id number] Name: Karla Reynolds  Number of Integrated Behavioral Health Clinician visits:: 1/6 Session Start time: 1:40 PM  Session End time: 2:15 PM Total time: 35 minutes  Type of Service: Integrated Behavioral Health- Individual/Family Interpretor:Yes.   Interpretor Name and Language: Spanish   SUBJECTIVE: Karla Reynolds is a 38 y.o. female accompanied by minor children  Patient was referred by FNP Tiburcio Pea for depression. Patient reports the following symptoms/concerns: Pt reports increased irritability, crying episodes, difficulty sleeping, and feelings of sadness. She receives limited support with the children due to spouse working outside of the state. Duration of problem: 3 months; Severity of problem: moderate  OBJECTIVE: Mood: Anxious and Depressed and Affect: Depressed Risk of harm to self or others: No plan to harm self or others  LIFE CONTEXT: Family and Social: Pt stated, "I have no help" Spouse works out of state and family that resides locally work School/Work: Pt denies financial stressors. Spouse is employed and she receives food stamps Self-Care: Pt is participating in medication management through PCP Life Changes: Pt is experiencing stress from primarily caring for minor children with minimal support. Pt's weight gain has also triggered symptoms of depression and anxiety  GOALS ADDRESSED: Patient will: 1. Reduce symptoms of: anxiety, depression and stress 2. Increase knowledge and/or ability of: coping skills and healthy habits  3. Demonstrate ability to: Increase healthy adjustment to current life circumstances and Increase adequate support systems for patient/family  INTERVENTIONS: Interventions utilized: Solution-Focused Strategies, Supportive Counseling, Psychoeducation and/or Health Education and Link to [employer]  Standardized Assessments completed: Not Needed  ASSESSMENT: Patient  currently experiencing depression and anxiety triggered by psychosocial stressors. Pt reports increased irritability, crying episodes, difficulty sleeping, and feelings of sadness. She receives limited support with the children due to spouse working outside of the state. Denies hx of suicidal/homicidal ideations.    Patient may benefit from psychoeducation and psychotherapy. LCSWA explained the correlation between one's physical and mental health, in addition, to how stress negatively impacts both. Therapeutic interventions were discussed to assist with decrease and/or management of symptoms. Supportive resources for support groups and local [employer] were discussed and will be mailed out to address on file. Legal Aid referral will be placed to assist with re-certification of food stamps (pt hasn't received any food assistance for two months) Discussed food mobile market dates with patient.  PLAN: 1. Follow up with behavioral health clinician on : Pt was encouraged to contact LCSWA if symptoms worsen or fail to improve to schedule behavioral appointments at [facility]. 2. Behavioral recommendations: LCSWA recommends that pt apply healthy coping skills discussed and utilize provided resources 3. Referral(s): Integrated Art gallery manager (In Clinic) and [employer]:  Food 4. "From scale of 1-10, how likely are you to follow plan?":   Bridgett Larsson, LCSW 09/09/2018 12:08 PM

## 2018-09-19 ENCOUNTER — Ambulatory Visit: Payer: Self-pay | Attending: Family Medicine

## 2018-10-06 ENCOUNTER — Encounter: Payer: Self-pay | Admitting: Family Medicine

## 2018-10-06 ENCOUNTER — Other Ambulatory Visit: Payer: Self-pay

## 2018-10-06 ENCOUNTER — Ambulatory Visit (INDEPENDENT_AMBULATORY_CARE_PROVIDER_SITE_OTHER): Payer: Self-pay | Admitting: Family Medicine

## 2018-10-06 VITALS — BP 105/70 | HR 73 | Temp 98.3°F | Resp 16 | Ht 64.0 in | Wt 185.4 lb

## 2018-10-06 DIAGNOSIS — F410 Panic disorder [episodic paroxysmal anxiety] without agoraphobia: Secondary | ICD-10-CM

## 2018-10-06 DIAGNOSIS — F411 Generalized anxiety disorder: Secondary | ICD-10-CM

## 2018-10-06 DIAGNOSIS — F331 Major depressive disorder, recurrent, moderate: Secondary | ICD-10-CM

## 2018-10-06 MED ORDER — BUSPIRONE HCL 10 MG PO TABS: 10.0000 mg | ORAL_TABLET | Freq: Three times a day (TID) | ORAL | 3 refills | Status: DC

## 2018-10-06 MED ORDER — BUPROPION HCL ER (XL) 150 MG PO TB24: 150.0000 mg | ORAL_TABLET | Freq: Every day | ORAL | 3 refills | Status: DC

## 2018-10-06 NOTE — Progress Notes (Signed)
[name], female    DOB: 1980-09-15, 38 y.o.   MRN: [id number]  PCP: Bing Neighbors, FNP  Chief Complaint  Patient presents with  . Depression  . Anxiety    Subjective:  HPI  Karla Reynolds is a 38 y.o. female presents for follow-up of anxiety and depression. Patient has completed one session face and face with LCSW, Jenel Lucks and one telephone encounter since our last visit. She reports counseling has been beneficial with improving her symptoms of depression. She is accompanied today by her husband who is sleeping and snoring very loudly during our visit today. She also has her minor child present.  Depression has improved. Although she endorses continued anxiety and continues to defer discussion back to her inability to lose weight. Since our last visit she has began walking and is frustrated that she is not loosing weight. TSH was normal 08/25/18. She further reports that each time she experiences anxiety, she calms herself by eating excessive amounts of food. Current Body mass index is 31.82 kg/m.  The medication isn't helping with anxiousness. She reports coming anxious direction with her children, daily home stressors, she eats excessively and these anxiety attacks occur.  He has begun walking and this is helped some with the anxiousness.  However she is interested in additional medication to help with her symptoms. Denies suicidal ideations, homicidal ideations, or auditory hallucinations.  Social History   Socioeconomic History  . Marital status: Single    Spouse name: Not on file  . Number of children: Not on file  . Years of education: Not on file  . Highest education level: Not on file  Occupational History  . Not on file  Social Needs  . Financial resource strain: Not on file  . Food insecurity:    Worry: Not on file    Inability: Not on file  . Transportation needs:    Medical: Not on file    Non-medical: Not on file  Tobacco Use   . Smoking status: Never Smoker  . Smokeless tobacco: Never Used  Substance and Sexual Activity  . Alcohol use: No  . Drug use: No  . Sexual activity: Not on file  Lifestyle  . Physical activity:    Days per week: Not on file    Minutes per session: Not on file  . Stress: Not on file  Relationships  . Social connections:    Talks on phone: Not on file    Gets together: Not on file    Attends religious service: Not on file    Active member of club or organization: Not on file    Attends meetings of clubs or organizations: Not on file    Relationship status: Not on file  . Intimate partner violence:    Fear of current or ex partner: Not on file    Emotionally abused: Not on file    Physically abused: Not on file    Forced sexual activity: Not on file  Other Topics Concern  . Not on file  Social History Narrative  . Not on file    No family history on file.   Review of Systems Pertinent negatives listed in HPI Patient Active Problem List   Diagnosis Date Noted  . Normal labor 11/16/2016  . Supervision of normal pregnancy, antepartum 08/15/2015  . Itching 08/15/2015    Allergies  Allergen Reactions  . Penicillins Other (See Comments)    Body goes to sleep Has patient had a PCN  reaction causing immediate rash, facial/tongue/throat swelling, SOB or lightheadedness with hypotension: Yes Has patient had a PCN reaction causing severe rash involving mucus membranes or skin necrosis: No Has patient had a PCN reaction that required hospitalization No Has patient had a PCN reaction occurring within the last 10 years: No If all of the above answers are "NO", then may proceed with Cephalosporin use.    Prior to Admission medications   Medication Sig Start Date End Date Taking? Authorizing Provider  buPROPion (WELLBUTRIN XL) 150 MG 24 hr tablet Take 1 tablet (150 mg total) by mouth daily. 08/25/18  Yes Bing Neighbors, FNP  sertraline (ZOLOFT) 50 MG tablet Take 1 tablet (50  mg total) by mouth daily. Patient not taking: Reported on 09/21/2014 03/12/14 03/18/15  Ambrose Finland, NP    Past Medical, Surgical Family and Social History reviewed and updated.    Objective:   Today's Vitals   10/06/18 1534  BP: 105/70  Pulse: 73  Resp: 16  Temp: 98.3 F (36.8 C)  SpO2: 96%  Weight: 185 lb 6.4 oz (84.1 kg)  Height: 5\' 4"  (1.626 m)    Wt Readings from Last 3 Encounters:  10/06/18 185 lb 6.4 oz (84.1 kg)  08/25/18 189 lb 6.4 oz (85.9 kg)  11/16/16 193 lb (87.5 kg)     Physical Exam General appearance: alert, well developed, well nourished, cooperative and in no distress Head: Normocephalic, without obvious abnormality, atraumatic Respiratory: Respirations even and unlabored, normal respiratory rate Heart: rate and rhythm normal. No gallop or murmurs noted on exam  Extremities: No gross deformities Skin: Skin color, texture, turgor normal. No rashes seen  Psych: Appropriate mood and affect. Neurologic: Mental status: Alert, oriented to person, place, and time, thought content appropriate.  No results found for: POCGLU  Lab Results  Component Value Date   HGBA1C 5.5 08/25/2018            Assessment & Plan:  1. Panic attacks 2. GAD (generalized anxiety disorder) -Trial patient on buspirone.  Patient educated extensively that a medication will not automatically stop her behavior of eating when anxiety occurs.  Recommended additional counseling sessions with [facility] as patient needs to develop better coping skills and coping behaviors actively deal with stress and anxiety.  Advised patient to follow-up with me 6 weeks if symptoms are still present will likely recommend additional behavioral health counseling through [facility].  3. Moderate episode of recurrent major depressive disorder (HCC) -Controlled with Buspirone    A total of 30 minutes spent, greater than 50 % of this time was spent with use of a Spanish interpreter , counseling , and  coordination of care.   -The patient was given clear instructions to go to ER or return to medical center if symptoms do not improve, worsen or new problems develop. The patient verbalized understanding.    Joaquin Courts, FNP Primary Care at [facility] [address], [place] [address] [phone]: [phone]

## 2018-10-06 NOTE — Patient Instructions (Signed)
Trastorno de ansiedad generalizada, en adultos Generalized Anxiety Disorder, Adult El trastorno de ansiedad generalizada (TAG) es un trastorno de salud mental. Las personas con esta afeccin se preocupan constantemente por los eventos de todos los das. A diferencia de la ansiedad normal, la preocupacin relacionada con el TAG no se produce por un evento especfico. Estas preocupaciones tampoco desaparecen ni mejoran con el tiempo. EL TAG interfiere con las funciones de la vida, incluidas las relaciones, el trabajo y la escuela. El TAG puede variar de leve a grave. Las personas con TAG grave tienen intensas oleadas de ansiedad con sntomas fsicos (crisis de angustia). Cules son las causas? Se desconoce la causa exacta del TAG. Qu incrementa el riesgo? Es ms probable que esta afeccin se manifieste en:  Mujeres.  Las personas que tienen antecedentes familiares de trastornos de ansiedad.  Las personas que son muy tmidas.  Las personas que experimentan eventos muy estresantes en la vida, tales como la muerte de un ser querido.  Las personas que tienen un entorno familiar muy estresante. Cules son los signos o los sntomas? Con frecuencia, las personas que padecen el TAG se preocupan excesivamente por muchas cosas en la vida, tales como su salud y su familia. Tambin pueden experimentar una preocupacin desmedida por lo siguiente:  Hacer las cosas bien.  Llegar a tiempo.  Los desastres naturales.  Las amistades. Los sntomas fsicos del TAG incluyen:  Fatiga.  Tensin muscular o contracciones musculares.  Temblores o agitacin.  Sobresaltarse con facilidad.  Sentir que el corazn late fuerte o est acelerado.  Sentir falta de aire o como que no se puede respirar profundamente.  Problemas para quedarse dormido o para seguir durmiendo.  Sudoracin.  Nuseas, diarrea o sndrome del intestino irritable (SII).  Dolores de cabeza.  Dificultad para concentrarse o  recordar hechos.  Agitacin.  Irritabilidad. Cmo se diagnostica? El mdico puede diagnosticar el TAG en funcin de los sntomas y los antecedentes mdicos. Se le realizar un examen fsico. El mdico le har preguntas especficas sobre sus sntomas, que incluyen qu tan graves son, cundo comenzaron y si van y vienen. Tambin puede hacerle preguntas sobre el consumo de alcohol o drogas, incluidos los medicamentos recetados. Su mdico podr derivarlo a un especialista de salud mental para ms evaluaciones. Su mdico le realizar un examen exhaustivo y puede hacer pruebas adicionales para descartar otras posibles causas de sus sntomas. Para recibir un diagnstico del TAG, una persona debe tener ansiedad que:  Est fuera de su control.  Afecte distintos aspectos de su vida, como el trabajo y las relaciones.  Cause angustia que le impida participar en sus actividades habituales.  Incluya al menos tres sntomas fsicos del TAG tales como inquietud, fatiga, dificultad para concentrarse, irritabilidad, tensin muscular o problemas para dormir. Antes de que su mdico pueda confirmar un diagnstico del TAG, estos sntomas deben estar presentes ms das de los que no lo estn y deben tener una duracin de seis meses o ms. Cmo se trata? Las siguientes terapias se usan generalmente para tratar el TAG:  Medicamentos. Por lo general, los medicamentos antidepresivos se recetan para un control diario a largo plazo. Se pueden agregar medicamentos para la ansiedad en casos graves, especialmente cuando ocurren crisis de angustia.  Psicoterapia (psicoanlisis). Determinados tipos de psicoterapia pueden ser tiles para tratar el TAG al brindar apoyo, educacin y orientacin. Entre las opciones se incluyen las siguientes: ? Terapia cognitivo conductual (TCC). Las personas aprenden habilidades para sobrellevar situaciones y tcnicas para aliviar la ansiedad.   Aprenden a identificar conductas y pensamientos  irreales o negativos, y a reemplazarlos por positivos. ? Terapia de aceptacin y compromiso (acceptance and commitment therapy, ACT). Este tratamiento ensea a las personas a ser conscientes como una forma de lidiar con pensamientos y sentimientos no deseados. ? Biorretroalimentacin. Este proceso lo capacita para Scientist, physiological respuesta del cuerpo Publishing copy) a travs de tcnicas de respiracin y mtodos de relajacin. Usted trabajar con un terapeuta mientras se usan mquinas para controlar sus sntomas fsicos.  Tcnicas para Charity fundraiser. Estas incluyen yoga, meditacin y ejercicio. Un especialista en salud mental puede ayudar a determinar qu tratamiento es el mejor para usted. Algunas Naval architect con solo un tipo de [place]. Sin embargo, Economist requieren una combinacin de terapias. Siga estas instrucciones en su casa:  Tome los medicamentos de venta libre y los recetados solamente como se lo haya indicado el mdico.  Trate de mantener una rutina normal.  Trate de anticipar situaciones estresantes y permita un tiempo adicional para controlarlas.  Participe de cualquier tcnica para autocalmarse o controlar el estrs segn las indicaciones de su mdico.  No se castigue ante los retrocesos o por no Sports coach.  Trate de reconocer sus logros aunque sean pequeos.  Concurra a todas las visitas de [address] se lo haya indicado el mdico. Esto es importante. Comunquese con un mdico si:  Los sntomas no mejoran.  Los sntomas empeoran.  Tiene signos de depresin tales como: ? Tristeza, mal humor o irritabilidad. ? Ya no disfruta de Industrial/product designer. ? Cambios en el peso y o en sus hbitos de alimentacin. ? Cambios en los hbitos de sueo. ? Evita a amigos y familiares. ? No tiene energa para realizar las tareas habituales. ? Tiene sentimientos de culpa o de minusvala. Solicite ayuda de inmediato si:   Tiene pensamientos serios acerca de lastimarse a usted mismo o daar a Economist. Si alguna vez siente que puede lastimarse o Physicist, medical a los dems, o piensa en poner fin a su vida, busque ayuda de inmediato. Puede dirigirse al servicio de urgencias ms cercano o comunicarse con:  El servicio de Sports administrator de su localidad (911 en los [place]).  Una lnea de asistencia al suicida y Visual merchandiser en crisis, como la [employer] de Prevencin del Suicidio (National Suicide Prevention Lifeline) al [phone]. Est disponible las 24 horas del da. Resumen  El trastorno de ansiedad generalizada (TAG) es un trastorno de salud mental que implica preocupacin no causada por un evento especfico.  Con frecuencia, las personas que padecen el TAG se preocupan excesivamente por muchas cosas en la vida, tales como su salud y [place].  El TAG puede causar sntomas fsicos tales como inquietud, dificultad para concentrarse, problemas para dormir, sudoracin frecuente, nuseas, diarrea, dolores de [place] y temblores, o contracciones musculares.  Un especialista en salud mental puede ayudar a determinar qu tratamiento es el mejor para usted. Algunas Naval architect con solo un tipo de [place]. Sin embargo, Economist requieren una combinacin de terapias. Esta informacin no tiene Theme park manager el consejo del mdico. Asegrese de hacerle al mdico cualquier pregunta que tenga. Document Released: 11/07/2012 Document Revised: 10/16/2016 Document Reviewed: 10/16/2016 Elsevier Interactive Patient Education  2019 [employer]. Crisis de angustia Panic Attack  [place] crisis de [place] es cuando de repente se siente muy asustado, incmodo o nervioso (ansioso). [place]omia Dear crisis de [place] puede ocurrir cuando tiene miedo sin motivo aparente. Puede producir una sensacin similar  a un problema grave. Incluso puede producir una sensacin similar a un infarto de miocardio o un accidente  cerebrovascular. Consulte al mdico cuando tenga una crisis de [place] para asegurarse de que no tiene un problema grave. Siga estas indicaciones en su casa:  Tome los medicamentos solamente como se lo haya indicado el mdico.  Si se siente preocupado o nervioso, trate de no consumir cafena.  Cuide [employer]. Para esto, haga lo siguiente: ? Consuma una dieta saludable. Asegrese de comer frutas frescas y verduras, cereales integrales, carnes magras y productos lcteos descremados. ? Duerma lo suficiente. Trate de dormir entre 7 y 8horas todas las noches. ? Realice actividad fsica. Intente realizar al menos de [place] fsica, 5 o ms das a la semana. ? No fume. Hable con el mdico si necesita ayuda para dejar de fumar. ? Limite la cantidad de alcohol que consume:  Si es mujer y no est embarazada, no beba ms de por da.  Si es hombre, no beba ms de por [employer].  Una medida equivale a 12oz ( ) de cerveza, 5oz ( ) de vino o 1oz (33ml) de bebidas alcohlicas de alta graduacin.  Concurra a todas las visitas de control como se lo haya indicado el mdico. Esto es importante. Comunquese con un mdico si:  Los sntomas no mejoran.  Los sntomas empeoran.  No puede tomar los [employer] se lo han indicado. Solicite ayuda de inmediato si:  Tiene pensamientos acerca de hacerse dao a usted mismo o a Economist.  Tiene sntomas de [place] crisis de [place]. No conduzca por sus propios medios [employer] hospital. Deje que alguien lo lleve o llame a una ambulancia. Si alguna vez siente que puede [address] a usted mismo o a otros, o tiene pensamientos de poner fin a su vida, busque ayuda de inmediato. Puede dirigirse al servicio de urgencias ms cercano o comunicarse con:  El servicio de Marine scientist (911 en los [place]).  Una lnea de asistencia al suicida y Visual merchandiser en crisis, como la [employer] de Prevencin del  Suicidio (National Suicide Prevention Lifeline) al [phone]. Est disponible las 24 horas del da. Resumen  [place] crisis de [place] es cuando de repente se siente muy asustado, incmodo o nervioso (ansioso).  Consulte al mdico cuando tenga una crisis de [place] para asegurarse de que no tiene otro problema grave.  Si siente que puede hacerse dao a usted mismo o a otros, llame al 911 y [place] de inmediato. Esta informacin no tiene Theme park manager el consejo del mdico. Asegrese de hacerle al mdico cualquier pregunta que tenga. Document Released: 10/28/2010 Document Revised: 01/18/2017 Document Reviewed: 01/18/2017 Elsevier Interactive Patient Education  2019 [employer].

## 2018-10-07 MED FILL — busPIRone HCL 10 MG TABS: 10 | 10 days supply | Qty: 30 | Fill #0

## 2018-10-07 MED FILL — BUPROPION HCL XL 150 MG TAB: 150 | 30 days supply | Qty: 30 | Fill #0

## 2018-10-18 ENCOUNTER — Ambulatory Visit: Payer: Self-pay | Admitting: Licensed Clinical Social Worker

## 2019-01-05 ENCOUNTER — Ambulatory Visit: Payer: Self-pay | Admitting: Family Medicine

## 2019-06-02 ENCOUNTER — Telehealth: Payer: Self-pay

## 2019-06-02 NOTE — Telephone Encounter (Signed)
Called patient to do their pre-visit COVID screening(Karla Reynolds, [phone]).  Have you tested positive for COVID or are you currently waiting for COVID test results? no  Have you recently traveled internationally([place], [place], [place], [place], [place]) or within the [place] to a hotspot area([place], [place], [place], [place], [place])? no  Are you currently experiencing any of the following symptoms: fever, cough, SHOB, fatigue, body aches, loss of smell/taste, rash, diarrhea, vomiting, severe headaches, weakness, sore throat? no  Have you been in contact with anyone who has recently travelled? no  Have you been in contact with anyone who is experiencing any of the above symptoms or been diagnosed with COVID  or works in or has recently visited a SNF? no

## 2019-06-05 ENCOUNTER — Ambulatory Visit: Payer: Self-pay

## 2019-06-16 ENCOUNTER — Other Ambulatory Visit: Payer: Self-pay

## 2019-06-16 ENCOUNTER — Ambulatory Visit: Admission: EM | Admit: 2019-06-16 | Discharge: 2019-06-16 | Discharge status: Absent without leave | Payer: Self-pay

## 2019-08-04 ENCOUNTER — Other Ambulatory Visit: Payer: Self-pay

## 2019-08-04 ENCOUNTER — Ambulatory Visit: Payer: Self-pay | Attending: Internal Medicine | Admitting: Internal Medicine

## 2019-08-04 DIAGNOSIS — H9312 Tinnitus, left ear: Secondary | ICD-10-CM

## 2019-08-04 DIAGNOSIS — H9202 Otalgia, left ear: Secondary | ICD-10-CM

## 2019-08-04 NOTE — Progress Notes (Signed)
Virtual Visit via Telephone Note Due to current restrictions/limitations of in-office visits due to the COVID-19 pandemic, this scheduled clinical appointment was converted to a telehealth visit  I connected with Karla Reynolds on 08/04/19 at 2:08 p.m by telephone and verified that I am speaking with the correct person using two identifiers. I am in my office.  The patient is at home.  Only the patient,myself and Derek Mound [phone]) from [facility] [employer] participated in this encounter.  I discussed the limitations, risks, security and privacy concerns of performing an evaluation and management service by telephone and the availability of in person appointments. I also discussed with the patient that there may be a patient responsible charge related to this service. The patient expressed understanding and agreed to proceed.   History of Present Illness: History of panic attacks/GAD, depression.  Patient last seen by NP Tiburcio Pea 01/2019  Since last visit, pt reports she stopped taking meds 2 mths ago.  Does not feel she needs them any more.  Feeling good.    C/o intermittent pain in LT and "a noise that does not allow me to listen."  Pain last 40-60 mins when it happens.  Uses hair pen to clean ear. The noise that she hears is intermittent and sounds like a whistle.    HM:  Agreeable to getting flu shot.  [facility]   Current Outpatient Medications on File Prior to Visit  Medication Sig Dispense Refill  . buPROPion (WELLBUTRIN XL) 150 MG 24 hr tablet Take 1 tablet (150 mg total) by mouth daily. 30 tablet 3  . busPIRone (BUSPAR) 10 MG tablet Take 1 tablet (10 mg total) by mouth 3 (three) times daily. 30 tablet 3  . [DISCONTINUED] sertraline (ZOLOFT) 50 MG tablet Take 1 tablet (50 mg total) by mouth daily. (Patient not taking: Reported on 09/21/2014) 30 tablet 3   No current facility-administered medications on file prior to visit.       Observations/Objective: No direct observation done  Assessment and Plan: 1. Ear pain, left 2. Tinnitus of left ear Advised to avoid using hair pain in the ear.  Can use a moistened Q-tip.  we will have them schedule her for an in person visit at [facility] to look in the ear.     Follow Up Instructions: CMA to arrange follow-up at [facility]   I discussed the assessment and treatment plan with the patient. The patient was provided an opportunity to ask questions and all were answered. The patient agreed with the plan and demonstrated an understanding of the instructions.   The patient was advised to call back or seek an in-person evaluation if the symptoms worsen or if the condition fails to improve as anticipated.  I provided 11 minutes of non-face-to-face time during this encounter.   Jonah Blue, MD

## 2019-09-01 ENCOUNTER — Ambulatory Visit: Payer: Self-pay | Admitting: Internal Medicine

## 2019-09-18 ENCOUNTER — Ambulatory Visit: Payer: Self-pay | Attending: Internal Medicine | Admitting: Family

## 2019-09-18 ENCOUNTER — Encounter: Payer: Self-pay | Admitting: Family

## 2019-09-18 ENCOUNTER — Other Ambulatory Visit: Payer: Self-pay

## 2019-09-18 VITALS — BP 114/72 | HR 64 | Temp 99.3°F | Resp 16 | Wt 198.2 lb

## 2019-09-18 DIAGNOSIS — H66002 Acute suppurative otitis media without spontaneous rupture of ear drum, left ear: Secondary | ICD-10-CM

## 2019-09-18 MED ORDER — CLARITHROMYCIN 500 MG PO TABS: 500.0000 mg | ORAL_TABLET | Freq: Two times a day (BID) | ORAL | 0 refills | Status: AC

## 2019-09-18 NOTE — Progress Notes (Signed)
[name], female    DOB: 13-Jan-1981  MRN: [id number]  CC: Follow-up (left ear pain )  Subjective: Karla Reynolds is a 39 y.o. female who history of itching and normal labor who presents for follow-up of left ear pain.  1. LEFT EAR PAIN FOLLOW-UP:  Location: left ear pain comes and goes, right ear sometimes Description: 8-10/10 Onset: 2 months ago, has noise in the ear that wouldn't let her hear and then pain started Modifying factors: nothing worse, Ibuprofen helps some  Symptoms  Sensation of fullness: Yes  Ear discharge: Yes, yellow, no smell, sometimes feels like she has water or bubbles inside of ear  URI symptoms:   Fever: Denies Tinnitus: Denies, but has a strange noise in the ear cant explain sounds like air swooshing  Dizziness:  Denies Hearing loss:  Yes, left ear Toothache: Yes  Rashes or lesions: Denies  Facial muscle weakness: Denies   Red Flags Recent trauma: Denies  PMH prior ear surgery:  Denies   Comments: Last night had pain and could not rest well. Last visit January 2021 with Dr. Laural Benes during that encounter patient was advised to avoid using hair pin to cleanse the ear but instead use a moistened Q-tip. Patient was referred to schedule an in person visit at [facility] to examine ear. Says she spoke with the doctor  from [facility] over the phone but did not receive any medications and was told to schedule another appointment here at [facility].   Patient Active Problem List   Diagnosis Date Noted  . Normal labor 11/16/2016  . Supervision of normal pregnancy, antepartum 08/15/2015  . Itching 08/15/2015     Current Outpatient Medications on File Prior to Visit  Medication Sig Dispense Refill  . [DISCONTINUED] sertraline (ZOLOFT) 50 MG tablet Take 1 tablet (50 mg total) by mouth daily. (Patient not taking: Reported on 09/21/2014) 30 tablet 3   No current facility-administered medications  on file prior to visit.    Allergies  Allergen Reactions  . Penicillins Other (See Comments)    Body goes to sleep Has patient had a PCN reaction causing immediate rash, facial/tongue/throat swelling, SOB or lightheadedness with hypotension: Yes Has patient had a PCN reaction causing severe rash involving mucus membranes or skin necrosis: No Has patient had a PCN reaction that required hospitalization No Has patient had a PCN reaction occurring within the last 10 years: No If all of the above answers are "NO", then may proceed with Cephalosporin use.    Social History   Socioeconomic History  . Marital status: Single    Spouse name: Not on file  . Number of children: Not on file  . Years of education: Not on file  . Highest education level: Not on file  Occupational History  . Not on file  Tobacco Use  . Smoking status: Never Smoker  . Smokeless tobacco: Never Used  Substance and Sexual Activity  . Alcohol use: No  . Drug use: No  . Sexual activity: Not on file  Other Topics Concern  . Not on file  Social History Narrative  . Not on file   Social Determinants of Health   Financial Resource Strain:   . Difficulty of Paying Living Expenses: Not on file  Food Insecurity:   . Worried About Programme researcher, broadcasting/film/video in the Last Year: Not on file  . Ran Out of Food in the Last Year: Not on file  Transportation Needs:   . Freight forwarder (Medical): Not on file  . Lack of Transportation (Non-Medical): Not on file  Physical Activity:   . Days of Exercise per Week: Not on file  . Minutes of Exercise per Session: Not on file  Stress:   . Feeling of Stress : Not on file  Social Connections:   . Frequency of Communication with Friends and Family: Not on file  . Frequency of Social Gatherings with Friends and Family: Not on file  . Attends Religious Services: Not on file  . Active Member of Clubs or Organizations: Not on file  . Attends Banker Meetings: Not on  file  . Marital Status: Not on file  Intimate Partner Violence:   . Fear of Current or Ex-Partner: Not on file  . Emotionally Abused: Not on file  . Physically Abused: Not on file  . Sexually Abused: Not on file    No family history on file.  Past Surgical History:  Procedure Laterality Date  . NO PAST SURGERIES      ROS: Review of Systems  Constitutional: Negative for chills and fever.  HENT: Positive for ear discharge, ear pain and hearing loss. Negative for sinus pain, sore throat and tinnitus.   Respiratory: Negative for cough, shortness of breath and wheezing.   Cardiovascular: Negative for chest pain and palpitations.  Gastrointestinal: Negative for heartburn, nausea and vomiting.   Negative except as stated above  PHYSICAL EXAM: BP 114/72   Pulse 64   Temp 99.3 F (37.4 C) (Oral)   Resp 16   Wt 198 lb 3.2 oz (89.9 kg)   SpO2 99%   BMI 34.02 kg/m   Physical Exam General appearance - alert, well appearing, and in no distress and oriented to person, place, and time Mental status - alert, oriented to person, place, and time, normal mood, behavior, speech, dress, motor activity, and thought processes Ears - bilateral TM's and external ear canals normal Chest - clear to auscultation, no wheezes, rales or rhonchi, symmetric air entry, no tachypnea, retractions or cyanosis Heart - normal rate, regular rhythm, normal S1, S2, no murmurs, rubs, clicks or gallops  CMP Latest Ref Rng & Units 08/25/2018 02/26/2016 04/01/2015  Glucose 65 - 99 mg/dL 84 82 94  BUN 6 - 20 mg/dL 11 18 9   Creatinine 0.57 - 1.00 mg/dL 3.81 0.17  Sodium 134 - 144 mmol/L 140 139 134(L)  Potassium 3.5 - 5.2 mmol/L 4.6 4.2 3.4(L)  Chloride 96 - 106 mmol/L 102 106 103  CO2 20 - 29 mmol/L 22 23 22   Calcium 8.7 - 10.2 mg/dL 9.6 8.9 8.9  Total Protein 6.0 - 8.5 g/dL 6.9 - 6.7  Total Bilirubin 0.0 - 1.2 mg/dL 5.10 - )  Alkaline Phos 39 - 117 IU/L 88 - 40  AST 0 - 40 IU/L 15 - 16  ALT 0 - 32 IU/L  17 - 13(L)   Lipid Panel     Component Value Date/Time   CHOL 187 02/23/2014 1052   TRIG 69 02/23/2014 1052   HDL 48 02/23/2014 1052   CHOLHDL 3.9 02/23/2014 1052   VLDL 14 02/23/2014 1052   LDLCALC 125 (H) 02/23/2014 1052    CBC    Component Value Date/Time   WBC 7.7 11/16/2016 0528   RBC 4.34 11/16/2016 0528   HGB 12.2 11/16/2016 0528   HCT 36.1 11/16/2016 0528   PLT 156 11/16/2016 0528   MCV 83.2 11/16/2016 0528   MCH  28.1 11/16/2016 0528   MCHC 33.8 11/16/2016 0528   RDW 13.4 11/16/2016 0528   LYMPHSABS 2,304 02/26/2016 1144   MONOABS 648 02/26/2016 1144   EOSABS 72 02/26/2016 1144   BASOSABS 0 02/26/2016 1144    ASSESSMENT AND PLAN: 1. Acute suppurative otitis media of left ear without spontaneous rupture of tympanic membrane, recurrence not specified: - clarithromycin (BIAXIN) 500 MG tablet; Take 1 tablet (500 mg total) by mouth 2 (two) times daily for 5 days.  Dispense: 10 tablet; Refill: 0 -Counseled patient to follow-up with attending physician if symptoms do not improve or worsen. -Patient was given clear instructions to go to Emergency Department or return to medical center if symptoms don't improve, worsen, or new problems develop.The patient verbalized understanding.  Patient was given the opportunity to ask questions.  Patient verbalized understanding of the plan and was able to repeat key elements of the plan.   No orders of the defined types were placed in this encounter.    Requested Prescriptions    No prescriptions requested or ordered in this encounter    No follow-ups on file.  Camillia Herter, NP

## 2019-09-18 NOTE — Patient Instructions (Addendum)
Follow-up with attending physician if symptoms of earache do not improve or worsen. Clarithromycin for earache. Follow-up with attending physician in 1 week for chronic conditions med refills. Dolor de [employer] adultos [place], Adult Un dolor de odo puede deberse a muchas causas, que incluyen lo siguiente:  Una infeccin.  Acumulacin de cerumen.  Presin en el odo.  Algo en el odo que no debera estar ah (cuerpo extrao).  Dolor de Advertising copywriter.  Problemas dentales.  Problemas en la mandbula. El tratamiento del dolor de odo depender de la causa. Si la causa no est clara o no se Counselling psychologist, deber observar los sntomas hasta que el dolor de odo desaparezca o hasta que se descubra la causa. Siga estas instrucciones en su casa: Medicamentos  Tome o aplquese los medicamentos de venta libre y los recetados solamente como se lo haya indicado el mdico.  Si le recetaron un antibitico, selo como se lo haya indicado el mdico. No deje de usar el antibitico aunque comience a Actor.  No se ponga nada en el odo que no sean los medicamentos que Glass blower/designer. Control del dolor Si se lo indican, aplique calor en la zona afectada con la frecuencia que le haya indicado el mdico. Use la fuente de calor que el mdico le recomiende, como una compresa de calor hmedo o una almohadilla trmica.  Coloque una toalla entre la piel y la fuente de Airline pilot.  Aplique calor durante 20 a .  Retire la fuente de calor si la piel se pone de color rojo brillante. Esto es especialmente importante si no puede sentir dolor, calor o fro. Puede correr un riesgo mayor de sufrir quemaduras. Si se lo indican, aplique hielo en la zona afectada con la frecuencia que le haya indicado el mdico. Para hacer esto:      Ponga el hielo en una bolsa plstica.  Coloque una toalla entre la piel y Copy.  Coloque el hielo durante , 2 a 3veces por Futures trader. Instrucciones  generales  Est atenta a cualquier cambio en los sntomas.  Intente descansar en posicin erguida, en lugar de recostarse. Esto puede ayudar a reducir la presin en el odo y Engineer, materials.  Mastique goma de mascar si esto ayuda a Engineer, materials de odos.  Trate cualquier [employer] se lo haya indicado el mdico.  Beba suficiente lquido como para Pharmacologist la orina de color amarillo plido.  Es su responsabilidad [employer] de cualquier prueba que se haya realizado. Consulte al mdico o pregunte en el departamento donde se realizan las pruebas cundo estarn [employer].  Concurra a todas las visitas de [address] se lo haya indicado el mdico. Esto es importante. Comunquese con un mdico si:  El dolor no mejora en el trmino de 2das.  El dolor de odo [place].  Aparecen nuevos sntomas.  Tiene fiebre. Busque ayuda de inmediato si:  Tiene un dolor de cabeza intenso.  Tiene rigidez en el cuello.  Tiene dificultad para tragar.  Tiene enrojecimiento o hinchazn detrs de Museum/gallery conservator.  Le sale sangre o lquido del odo.  Pierde la audicin.  Se siente mareado. Resumen  El dolor de odo puede deberse a muchas causas.  El tratamiento del dolor de odo depender de la causa. Siga las recomendaciones del mdico para tratar su dolor de odo.  Si la causa no est clara o no se Counselling psychologist, deber observar los sntomas hasta que el dolor de odo  desaparezca o hasta que se descubra la causa.  Concurra a todas las visitas de seguimiento como se lo haya indicado el mdico. Esto es importante. Esta informacin no tiene Marine scientist el consejo del mdico. Asegrese de hacerle al mdico cualquier pregunta que tenga. Document Revised: 03/30/2019 Document Reviewed: 03/30/2019 Elsevier Patient Education  [place].

## 2019-09-19 MED FILL — CLARITHROMYCIN 500 MG TAB: 500 | 5 days supply | Qty: 10 | Fill #0

## 2019-09-29 ENCOUNTER — Ambulatory Visit: Payer: Self-pay | Attending: Family | Admitting: Family

## 2019-09-29 ENCOUNTER — Other Ambulatory Visit: Payer: Self-pay

## 2019-09-29 ENCOUNTER — Encounter: Payer: Self-pay | Admitting: Family

## 2019-09-29 VITALS — BP 106/71 | HR 70 | Temp 97.3°F | Resp 16 | Wt 200.0 lb

## 2019-09-29 DIAGNOSIS — Z01419 Encounter for gynecological examination (general) (routine) without abnormal findings: Secondary | ICD-10-CM

## 2019-09-29 NOTE — Patient Instructions (Signed)
Follow-up with attending physician as needed. Prueba de Papanicolaou Pap Test Por qu me debo realizar esta prueba? La prueba de Papanicolaou, tambin denominada citologa vaginal, es una prueba de cribado para Hydrographic surveyor signos de:  Cncer de la vagina, del cuello uterino y del tero. El cuello uterino es la parte baja del tero que se abre hacia la vagina.  Infeccin.  Cambios que podran ser un signo de que se est desarrollando un cncer (cambios precancerosos). Las mujeres deben realizarse esta prueba con regularidad. En general, debe hacerse una prueba de Papanicolaou cada 3 aos hasta alcanzar la menopausia o hasta los 65 aos. Las [employer] 30 y 51 aos de edad pueden elegir realizarse la prueba de Papanicolaou al mismo tiempo que la prueba del VPH (virus del papiloma humano) cada 5 aos (en lugar de cada 3 aos). El mdico puede recomendarle que se realice pruebas de Papanicolaou con ms o menos frecuencia en funcin de sus afecciones mdicas y los resultados de la prueba de Papanicolaou anterior. Qu tipo de [place] se toma?  El mdico recolectar una muestra de clulas de la superficie del cuello uterino. Lo har utilizando un pequeo hisopo de algodn, una esptula de plstico o un cepillo. Esta muestra se recolecta durante un examen plvico, mientras usted est recostada boca arriba sobre la mesa de examen con los pies en los descansos para pies (estribos). En algunos casos, tambin pueden recolectarse fluidos (secreciones) del cuello uterino y la vagina. Cmo debo prepararme para esta prueba?  Tenga en cuenta en qu etapa del ciclo menstrual se encuentra. Es posible que se le pida que vuelva a Risk manager la prueba si est Forensic psychologist en que debe Radiation protection practitioner.  Si el da en que debe realizarse la prueba tiene una infeccin vaginal aparente, deber volver a Editor, commissioning prueba.  Siga las indicaciones del mdico acerca de lo siguiente: ? Cambiar o suspender los  medicamentos que toma habitualmente. Algunos medicamentos pueden [employer] de la prueba, como los digitlicos y Lexicographer. ? Evite las duchas vaginales o los baos de inmersin el da de la prueba o Games developer anterior. Informe al mdico acerca de lo siguiente:  Cualquier alergia que tenga.  Todos los [employer], incluidos vitaminas, hierbas, gotas oftlmicas, cremas y medicamentos de venta libre.  Cualquier enfermedad de la sangre que tenga.  Cirugas a las que se someti.  Cualquier afeccin mdica que tenga.  Si est embarazada o podra estarlo. Cmo se informan los resultados? Los [employer] de la prueba se informarn como anormales o normales. Puede producirse un resultado positivo falso. Este tipo de resultado es incorrecto porque indica que una enfermedad est presente cuando en realidad no lo est. Puede producirse un resultado negativo falso. Este tipo de resultado es incorrecto porque indica que una enfermedad no est presente cuando en realidad lo est. Qu significan los [place]? Un resultado normal en la prueba significa que no tiene signos de cncer de la vagina, del cuello uterino o del tero. Un resultado anormal puede significar que tiene:  Cncer. Una prueba de Papanicolaou por s sola no es suficiente para Community education officer. En este caso, se le realizarn ms pruebas.  Cambios precancerosos en la vagina, cuello uterino o tero.  Inflamacin del cuello uterino.  Enfermedades de transmisin sexual (ETS).  Infecciones por hongos.  Infecciones por parsitos. Hable con su mdico sobre lo que significan sus [place]. Preguntas para hacerle al mdico Consulte a su mdico o pregunte en el departamento donde se realiza  la prueba acerca de lo siguiente:  Cundo estarn disponibles mis resultados?  Cmo obtendr mis resultados?  Cules son mis opciones de tratamiento?  Qu otras pruebas necesito?  Cules son los  prximos pasos que debo seguir? Resumen  En general, las mujeres deben hacerse una prueba de Papanicolaou cada 3 aos Engineer, maintenance la menopausia o [employer] 65 aos de [place].  El mdico recolectar una muestra de clulas de la superficie del cuello uterino. Lo har utilizando un pequeo hisopo de algodn, una esptula de plstico o un cepillo.  En algunos casos, tambin pueden recolectarse fluidos (secreciones) del cuello uterino y la vagina. Esta informacin no tiene Theme park manager el consejo del mdico. Asegrese de hacerle al mdico cualquier pregunta que tenga. Document Revised: 06/22/2017 Document Reviewed: 06/22/2017 Elsevier Patient Education  2020 [employer].

## 2019-09-29 NOTE — Progress Notes (Signed)
[name], female    DOB: 07-Feb-1981  MRN: [id number]  CC: PAP smear   Subjective: Karla Reynolds is a 38 y.o. female with history or itching and normal labor who presents for PAP smear. [address] [place] ID# [id number], assisted with this visit.   1. PAP SMEAR:  Age at menarche: 89  Age at menopause:  Not applicable Menses duration, flow, frequency: No flow r/t implant. Period comes every 5 to 6 months, lasts 8 or 9 days, light flow. Implant scheduled to come out next month. Pregnancies: 4 Births: 4  Terminated pregnancies: 1  Sexual health concerns: Denies Sexually active: Yes  Contraception use past, present: Denies Previous STIs: Denies Previous PID: Denies  Vaginal discharge: Denies Vaginal lesions: Denies Pelvic pain: Denies Uterine fibroids:  Denies Urinary incontinence: Denies Bowel incontinence: Denies Previous GYN procedures: Denies Last PAP: 2015 Results of last PAP: Negative for infections and malignancy Testing for STIs today? Yes Testing for HIV today? Yes  Colonoscopy: Denies  Mammogram: Denies, no breast cancer history. States breasts are tender occasionally.  2. EAR PAIN FOLLOW-UP:   Last visit with me in February 2021 during that visit clarithromycin was given for acute suppurative otitis media.  Since then patient denies ear pain and feeling well.   Patient Active Problem List   Diagnosis Date Noted  . Normal labor 11/16/2016  . Supervision of normal pregnancy, antepartum 08/15/2015  . Itching 08/15/2015     Current Outpatient Medications on File Prior to Visit  Medication Sig Dispense Refill  . [DISCONTINUED] sertraline (ZOLOFT) 50 MG tablet Take 1 tablet (50 mg total) by mouth daily. (Patient not taking: Reported on 09/21/2014) 30 tablet 3   No current facility-administered medications on file prior to visit.    Allergies  Allergen Reactions  . Penicillins Other (See Comments)    Body goes to  sleep Has patient had a PCN reaction causing immediate rash, facial/tongue/throat swelling, SOB or lightheadedness with hypotension: Yes Has patient had a PCN reaction causing severe rash involving mucus membranes or skin necrosis: No Has patient had a PCN reaction that required hospitalization No Has patient had a PCN reaction occurring within the last 10 years: No If all of the above answers are "NO", then may proceed with Cephalosporin use.    Social History   Socioeconomic History  . Marital status: Single    Spouse name: Not on file  . Number of children: Not on file  . Years of education: Not on file  . Highest education level: Not on file  Occupational History  . Not on file  Tobacco Use  . Smoking status: Never Smoker  . Smokeless tobacco: Never Used  Substance and Sexual Activity  . Alcohol use: No  . Drug use: No  . Sexual activity: Not on file  Other Topics Concern  . Not on file  Social History Narrative  . Not on file   Social Determinants of Health   Financial Resource Strain:   . Difficulty of Paying Living Expenses: Not on file  Food Insecurity:   . Worried About Programme researcher, broadcasting/film/video in the Last Year: Not on file  . Ran Out of Food in the Last Year: Not on file  Transportation Needs:   . Lack of Transportation (Medical): Not on file  . Lack of Transportation (Non-Medical): Not on file  Physical Activity:   . Days of Exercise per Week: Not on file  . Minutes of Exercise per Session: Not  on file  Stress:   . Feeling of Stress : Not on file  Social Connections:   . Frequency of Communication with Friends and Family: Not on file  . Frequency of Social Gatherings with Friends and Family: Not on file  . Attends Religious Services: Not on file  . Active Member of Clubs or Organizations: Not on file  . Attends Archivist Meetings: Not on file  . Marital Status: Not on file  Intimate Partner Violence:   . Fear of Current or Ex-Partner: Not on file   . Emotionally Abused: Not on file  . Physically Abused: Not on file  . Sexually Abused: Not on file    No family history on file.  Past Surgical History:  Procedure Laterality Date  . NO PAST SURGERIES      ROS: Review of Systems  Respiratory: Negative for cough and shortness of breath.   Cardiovascular: Negative for chest pain and palpitations.  Gastrointestinal: Negative for nausea and vomiting.  Negative except as stated above  PHYSICAL EXAM: Vitals with BMI 09/29/2019 09/18/2019 10/06/2018  Height - - 5\' 4"   Weight 200 lbs 198 lbs 3 oz 185 lbs 6 oz  BMI - - 67.61  Systolic 950 932 671  Diastolic 71 72 70  Pulse 70 64 73    Physical Exam General appearance - alert, well appearing, and in no distress and oriented to person, place, and time Mental status - alert, oriented to person, place, and time, normal mood, behavior, speech, dress, motor activity, and thought processes Lymphatics - no palpable lymphadenopathy, no hepatosplenomegaly Chest - clear to auscultation, no wheezes, rales or rhonchi, symmetric air entry, no tachypnea, retractions or cyanosis Heart - normal rate, regular rhythm, normal S1, S2, no murmurs, rubs, clicks or gallops Breasts - breasts appear normal, no suspicious masses, no skin or nipple changes or axillary nodes Pelvic - normal external genitalia, vulva, vagina, cervix, uterus and adnexa, exam chaperoned by Orlan Leavens  CMP Latest Ref Rng & Units 08/25/2018 02/26/2016 04/01/2015  Glucose 65 - 99 mg/dL 84 82 94  BUN 6 - 20 mg/dL 11 18 9   Creatinine 0.57 - 1.00 mg/dL 0.59 0.60 0.54  Sodium 134 - 144 mmol/L 140 139 134(L)  Potassium 3.5 - 5.2 mmol/L 4.6 4.2 3.4(L)  Chloride 96 - 106 mmol/L 102 106 103  CO2 20 - 29 mmol/L 22 23 22   Calcium 8.7 - 10.2 mg/dL 9.6 8.9 8.9  Total Protein 6.0 - 8.5 g/dL 6.9 - 6.7  Total Bilirubin 0.0 - 1.2 mg/dL <0.2 - 0.2(L)  Alkaline Phos 39 - 117 IU/L 88 - 40  AST 0 - 40 IU/L 15 - 16  ALT 0 - 32 IU/L 17 - 13(L)    Lipid Panel     Component Value Date/Time   CHOL 187 02/23/2014 1052   TRIG 69 02/23/2014 1052   HDL 48 02/23/2014 1052   CHOLHDL 3.9 02/23/2014 1052   VLDL 14 02/23/2014 1052   LDLCALC 125 (H) 02/23/2014 1052    CBC    Component Value Date/Time   WBC 7.7 11/16/2016 0528   RBC 4.34 11/16/2016 0528   HGB 12.2 11/16/2016 0528   HCT 36.1 11/16/2016 0528   PLT 156 11/16/2016 0528   MCV 83.2 11/16/2016 0528   MCH 28.1 11/16/2016 0528   MCHC 33.8 11/16/2016 0528   RDW 13.4 11/16/2016 0528   LYMPHSABS 2,304 02/26/2016 1144   MONOABS 648 02/26/2016 1144   EOSABS 72 02/26/2016 1144  BASOSABS 0 02/26/2016 1144    ASSESSMENT AND PLAN: 1. Well woman exam with routine gynecological exam:  - Cervicovaginal ancillary only - Cytology - PAP - HIV antibody (with reflex)  Patient was given the opportunity to ask questions.  Patient verbalized understanding of the plan and was able to repeat key elements of the plan. Patient was given clear instructions to go to Emergency Department or return to medical center if symptoms don't improve, worsen, or new problems develop.The patient verbalized understanding.   Requested Prescriptions    No prescriptions requested or ordered in this encounter    Sharol Croghan Jodi Geralds, NP

## 2019-10-02 LAB — CERVICOVAGINAL ANCILLARY ONLY
Bacterial Vaginitis (gardnerella): NEGATIVE
Candida Glabrata: NEGATIVE
Candida Vaginitis: NEGATIVE
Chlamydia: NEGATIVE
Comment: NEGATIVE
Comment: NEGATIVE
Comment: NEGATIVE
Comment: NEGATIVE
Comment: NEGATIVE
Comment: NORMAL
Neisseria Gonorrhea: NEGATIVE
Trichomonas: NEGATIVE

## 2019-10-02 NOTE — Progress Notes (Signed)
Cervicovaginal negative. Awaiting results of PAP and HIV.

## 2019-10-03 LAB — CYTOLOGY - PAP
Comment: NEGATIVE
Diagnosis: UNDETERMINED — AB
High risk HPV: NEGATIVE

## 2019-10-04 NOTE — Progress Notes (Signed)
Please call patient with update. PAP is positive for abnormal cells w/ negative HPV results. Patient should schedule repeat co-testing in 3 years instead of the routine 5 year co-testing schedule. No further treatment is needed. Note- still awaiting HIV result.

## 2019-10-06 ENCOUNTER — Telehealth: Payer: Self-pay

## 2019-10-06 NOTE — Telephone Encounter (Signed)
[employer] Karla Reynolds  Id# [phone]  contacted pt to go over pap results pt is aware and doesn't have any questions or concerns

## 2020-05-22 ENCOUNTER — Ambulatory Visit (INDEPENDENT_AMBULATORY_CARE_PROVIDER_SITE_OTHER): Payer: Self-pay | Admitting: Physician Assistant

## 2020-05-22 ENCOUNTER — Encounter: Payer: Self-pay | Admitting: Physician Assistant

## 2020-05-22 ENCOUNTER — Telehealth: Payer: Self-pay

## 2020-05-22 ENCOUNTER — Other Ambulatory Visit: Payer: Self-pay

## 2020-05-22 VITALS — BP 109/68 | HR 84 | Temp 98.2°F | Resp 18 | Ht 62.0 in | Wt 186.0 lb

## 2020-05-22 DIAGNOSIS — N3000 Acute cystitis without hematuria: Secondary | ICD-10-CM

## 2020-05-22 LAB — POCT URINALYSIS DIP (CLINITEK)
Glucose, UA: NEGATIVE mg/dL
Nitrite, UA: POSITIVE — AB
POC PROTEIN,UA: 100 — AB
Spec Grav, UA: >=1.03 — AB (ref 1.010–1.025)
Urobilinogen, UA: 0.2 E.U./dL
pH, UA: 6 (ref 5.0–8.0)

## 2020-05-22 MED ORDER — NITROFURANTOIN MONOHYD MACRO 100 MG PO CAPS: 100.0000 mg | ORAL_CAPSULE | Freq: Two times a day (BID) | ORAL | 0 refills | Status: AC

## 2020-05-22 NOTE — Telephone Encounter (Signed)
Patient contacted the office about a same day visit. Staff and interpreter contacted the patient on a conference call.

## 2020-05-22 NOTE — Patient Instructions (Signed)
Infeccin urinaria en los adultos Urinary Tract Infection, Adult  Una infeccin urinaria (IU) puede ocurrir en cualquier lugar de las vas urinarias. Las vas urinarias incluyen a los riones, los urteres, la vejiga y la uretra. Estos rganos fabrican, almacenan y eliminan la orina del organismo. El mdico puede usar otras palabras para describir la infeccin. La IU alta afecta los urteres y a los riones (pielonefritis). La IU baja afecta la vejiga (cistitis) y la uretra (uretritis). Cules son las causas? La mayora de las infecciones de las vas urinarias es causada por la presencia de bacterias en la zona genital, alrededor de la entrada de las vas urinarias (uretra). Estas bacterias proliferan y causan inflamacin en las vas urinarias. Qu incrementa el riesgo? Es ms probable que contraiga esta afeccin si:  Tiene colocado un catter urinario (sonda urinaria) permanente.  No puede controlar cundo orinar o defecar (tiene incontinencia).  Es mujer y usted: ? Utiliza espermicida o diafragma como mtodo anticonceptivo. ? Tiene niveles bajos de estrgenos. ? Estn embarazadas.  Tiene ciertos genes que aumentan su riesgo (gentica).  Es sexualmente activa.  Toma antibiticos.  Tiene una afeccin que causa que el flujo de orina sea lento, como: ? Prstata agrandada, si usted es hombre. ? Obstruccin de la uretra (estenosis). ? Clculo renal. ? Una afeccin nerviosa que afecta el control de la vejiga (vejiga neurgena). ? No bebe lo suficiente o no orina con frecuencia.  Tiene ciertas enfermedades crnicas, como: ? Diabetes. ? Un sistema que combate las enfermedades (sistemainmunitario) debilitado. ? Anemia drepanoctica. ? Gota. ? Lesin en la mdula espinal. Cules son los signos o los sntomas? Los sntomas de esta afeccin incluyen:  Necesidad inmediata (urgente) de orinar.  Miccin frecuente o eliminacin de pequeas cantidades de orina con frecuencia.  Ardor o  dolor al orinar.  Presencia de sangre en la orina.  Orina con mal olor u olor atpico.  Dificultad para orinar.  Orina turbia.  Secrecin vaginal, si es mujer.  Dolor en el abdomen o en la parte inferior de la espalda. Es posible que tambin tenga:  Vmitos o disminucin del apetito.  Confusin.  Irritabilidad o cansancio.  Fiebre.  Diarrea. El primer sntoma en los adultos mayores puede ser la confusin. En algunos casos, es posible que no tengan sntomas hasta que la infeccin empeore. Cmo se diagnostica? Esta afeccin se diagnostica en funcin de sus antecedentes mdicos y de un examen fsico. Tambin pueden hacerle otras pruebas, incluidas las siguientes:  Anlisis de orina.  Anlisis de sangre.  Pruebas de infecciones de transmisin sexual (ITS). Si ha tenido ms de una infeccin urinaria (IU), se pueden hacer estudios de diagnstico por imgenes o una cistoscopia para determinar la causa de las infecciones. Cmo se trata? El tratamiento de esta afeccin incluye lo siguiente:  Antibiticos.  Medicamentos de venta libre para aliviar las molestias.  Beber una cantidad suficiente agua para mantenerse hidratado. Si tiene infecciones con frecuencia o tiene otras afecciones, como un clculo renal, es posible que deba ver a un mdico especialista en las vas urinarias (urlogo). En casos poco frecuentes, las infecciones urinarias pueden provocar sepsis. La sepsis es una afeccin potencialmente mortal que se produce cuando el cuerpo responde a una infeccin. La sepsis se trata en el hospital con antibiticos, lquidos y otros medicamentos que se administran por va intravenosa. Sigue estas instrucciones en tu casa:  Medicamentos  Toma los medicamentos de venta libre y los recetados solamente como se lo haya indicado el mdico.  Si le recetaron   un antibitico, tmelo como se lo haya indicado el mdico. No deje de usar el antibitico aunque comience a sentirse  mejor. Indicaciones generales  Asegrese de hacer lo siguiente: ? Vaciar la vejiga con frecuencia y en su totalidad. No contener la orina durante largos perodos. ? Vaciar la vejiga despus de tener sexo. ? Limpiarse de adelante hacia atrs despus de defecar, si es mujer. Use cada trozo de papel higinico solo una vez cuando se limpie.  Beba suficiente lquido como para mantener la orina de color amarillo plido.  Concurrir a todas las visitas de seguimiento como se lo haya indicado el mdico. Esto es importante. Comuncate con un mdico si:  Los sntomas no mejoran despus de 1 o 2das de tratamiento.  Los sntomas desaparecen y luego vuelven a aparecer. Solicite ayuda inmediatamente si tiene:  Dolor intenso en la espalda o en la parte inferior del abdomen.  Fiebre.  Nuseas o vmitos. Resumen  Una infeccin urinaria (IU) es una infeccin en cualquier parte de las vas urinarias, que incluyen los riones, los urteres, la vejiga y la uretra.  La mayora de las infecciones de las vas urinarias es causada por la presencia de bacterias en la zona genital, alrededor de la entrada de las vas urinarias (uretra).  El tratamiento de esta afeccin suele incluir antibiticos.  Si le recetaron un antibitico, tmelo como se lo haya indicado el mdico. No deje de usar el antibitico aunque comience a sentirse mejor.  Concurrir a todas las visitas de seguimiento como se lo haya indicado el mdico. Esto es importante. Esta informacin no tiene como fin reemplazar el consejo del mdico. Asegrese de hacerle al mdico cualquier pregunta que tenga. Document Revised: 07/06/2018 Document Reviewed: 07/06/2018 Elsevier Patient Education  2020 [employer]  

## 2020-05-22 NOTE — Progress Notes (Signed)
[name], Karla Rail, MD Chief Complaint  Patient presents with  . Urinary Tract Infection   Karla Reynolds, 39 yr old female Current Issues:  Presents with 21 days of dysuria and urinary frequency Associated symptoms include:  chills, dysuria, fever intermittment (unmeasured), lower abdominal pain and bilateral lower back pain Worse in the past 3 days  Has been taking cranberry juice and ibuprofen with little relief.  There is a previous history of of similar symptoms. Sexually active:  Yes with female.   No concern for STI.  Prior to Admission medications   Medication Sig Start Date End Date Taking? Authorizing Provider  sertraline (ZOLOFT) 50 MG tablet Take 1 tablet (50 mg total) by mouth daily. Patient not taking: Reported on 09/21/2014 03/12/14 03/18/15  Ambrose Finland, NP   Review of Systems  Constitutional: Positive for fever. Negative for chills and malaise/fatigue.  HENT: Negative.   Eyes: Negative.   Respiratory: Negative.   Cardiovascular: Negative.   Gastrointestinal: Negative.   Genitourinary: Positive for dysuria, frequency, hematuria and urgency.       On menses  Musculoskeletal: Positive for back pain.  Skin: Negative.   Neurological: Negative.   Endo/Heme/Allergies: Negative.   Psychiatric/Behavioral: Negative.       PE:  BP 109/68 (BP Location: Left Arm, Patient Position: Sitting, Cuff Size: Normal)   Pulse 84   Temp 98.2 F (36.8 C) (Oral)   Resp 18   Ht 5\' 2"  (1.575 m)   Wt 186 lb (84.4 kg)   SpO2 97%   BMI 34.02 kg/m   Physical Exam Vitals and nursing note reviewed.  Constitutional:      General: She is not in acute distress.    Appearance: Normal appearance. She is not ill-appearing.  HENT:     Head: Normocephalic and atraumatic.     Right Ear: External ear normal.     Left Ear: External ear normal.     Nose: Nose normal.     Mouth/Throat:     Mouth: Mucous membranes are moist.     Pharynx: Oropharynx is clear.  Eyes:      Conjunctiva/sclera: Conjunctivae normal.     Pupils: Pupils are equal, round, and reactive to light.  Cardiovascular:     Rate and Rhythm: Normal rate and regular rhythm.     Pulses: Normal pulses.     Heart sounds: Normal heart sounds.  Pulmonary:     Effort: Pulmonary effort is normal.     Breath sounds: Normal breath sounds.  Abdominal:     Tenderness: There is abdominal tenderness in the suprapubic area. There is right CVA tenderness and left CVA tenderness.  Musculoskeletal:        General: Normal range of motion.     Cervical back: Normal range of motion and neck supple.  Skin:    General: Skin is warm and dry.  Neurological:     General: No focal deficit present.     Mental Status: She is alert and oriented to person, place, and time.  Psychiatric:        Mood and Affect: Mood normal.        Behavior: Behavior normal.        Thought Content: Thought content normal.        Judgment: Judgment normal.     Results for orders placed or performed in visit on 09/29/19  Cervicovaginal ancillary only  Result Value Ref Range   Neisseria Gonorrhea Negative    Chlamydia Negative  Trichomonas Negative    Bacterial Vaginitis (gardnerella) Negative    Candida Vaginitis Negative    Candida Glabrata Negative    Comment Normal Reference Range Candida Species - Negative    Comment Normal Reference Range Candida Galbrata - Negative    Comment Normal Reference Range Trichomonas - Negative    Comment Normal Reference Ranger Chlamydia - Negative    Comment      Normal Reference Range Neisseria Gonorrhea - Negative   Comment      Normal Reference Range Bacterial Vaginosis - Negative  Cytology - PAP  Result Value Ref Range   High risk HPV Negative    Adequacy      Satisfactory for evaluation; transformation zone component PRESENT.   Diagnosis (A)     - Atypical squamous cells of undetermined significance (ASC-US)   Comment Normal Reference Range HPV - Negative     1. Acute  cystitis without hematuria Patient education given Increase hydration, continue ibuprofen prn The patient was given clear instructions to go to ER or return to medical center if symptoms don't improve, worsen or new problems develop. The patient verbalized understanding. The patient was told to call to get lab results if they haven't heard anything in the next week.   - Urine Culture - nitrofurantoin, macrocrystal-monohydrate, (MACROBID) 100 MG capsule; Take 1 capsule (100 mg total) by mouth 2 (two) times daily for 5 days.  Dispense: 10 capsule; Refill: 0   I have reviewed the patient's medical history (PMH, PSH, Social History, Family History, Medications, and allergies) , and have been updated if relevant. I spent 20 minutes reviewing chart and  face to face time with patient.    I have reviewed the patient's medical history (PMH, PSH, Social History, Family History, Medications, and allergies) , and have been updated if relevant. I spent 30 minutes reviewing chart and  face to face time with patient.   Follow up as needed  Maurene Capes, PA-C

## 2020-05-26 LAB — URINE CULTURE

## 2020-05-29 ENCOUNTER — Telehealth: Payer: Self-pay | Admitting: *Deleted

## 2020-05-29 NOTE — Telephone Encounter (Signed)
-----   Message from Roney Jaffe, [place] sent at 05/27/2020 11:44 AM EDT ----- Please let patient know her culture did show that she should have resolution with the abx that was prescribed.

## 2020-05-29 NOTE — Telephone Encounter (Signed)
Medical Assistant used [employer] to contact patient.  Interpreter Name: Marlise Eves Interpreter #: [phone] Patient verified DOB Patient is aware of e coli being noted and the antibiotic prescribed should have provided relief. Patient reports the pain is not as bad a before. Patient does report burning still being present while urinating. Patient instructed to finish the antibiotic and may have repeat UA if the problem persist. Patient has been taking 1 capsule a day instead of 1 capsule BID. Patient advised to begin taking BID as instructed

## 2020-12-26 NOTE — Progress Notes (Signed)
Patient ID: Mayelin Panos, female    DOB: 1980-11-19  MRN: [id number]  CC: Back Pain  Subjective: Daurice Herrera-Santos is a 40 y.o. female who presents for back pain.   Her concerns today include:   1. BACK PAIN: Location: right lower  Onset: 1 month  Description: 10/10 Radiation: stomach  Worse with: walking and trying to get in comfortable position Better with: chamomile tea and Acetaminophen  Trauma: no, does do heavy lifting Popping/clicking sounds with movement/bending: no Muscle spasms: no  2. ABDOMINAL PAIN: Location: in the center  Onset: 1 month  Description: 10/10  Nausea/Vomiting: nausea only Diarrhea: no Constipation: no   Melena/BRBPR: blood on the paper with wiping  Hematemesis: no  Heartburn/acid reflux: no Fever/Chills: no Dysuria: yes  Back pain: yes   Alcohol use: no   Patient Active Problem List   Diagnosis Date Noted  . Normal labor 11/16/2016  . Supervision of normal pregnancy, antepartum 08/15/2015  . Itching 08/15/2015     Current Outpatient Medications on File Prior to Visit  Medication Sig Dispense Refill  . [DISCONTINUED] sertraline (ZOLOFT) 50 MG tablet Take 1 tablet (50 mg total) by mouth daily. (Patient not taking: Reported on 09/21/2014) 30 tablet 3   No current facility-administered medications on file prior to visit.    Allergies  Allergen Reactions  . Penicillins Other (See Comments)    Body goes to sleep Has patient had a PCN reaction causing immediate rash, facial/tongue/throat swelling, SOB or lightheadedness with hypotension: Yes Has patient had a PCN reaction causing severe rash involving mucus membranes or skin necrosis: No Has patient had a PCN reaction that required hospitalization No Has patient had a PCN reaction occurring within the last 10 years: No If all of the above answers are "NO", then may proceed with Cephalosporin use.    Social History   Socioeconomic History  . Marital status: Single     Spouse name: Not on file  . Number of children: Not on file  . Years of education: Not on file  . Highest education level: Not on file  Occupational History  . Not on file  Tobacco Use  . Smoking status: Never Smoker  . Smokeless tobacco: Never Used  Substance and Sexual Activity  . Alcohol use: No  . Drug use: No  . Sexual activity: Yes  Other Topics Concern  . Not on file  Social History Narrative  . Not on file   Social Determinants of Health   Financial Resource Strain: Not on file  Food Insecurity: Not on file  Transportation Needs: Not on file  Physical Activity: Not on file  Stress: Not on file  Social Connections: Not on file  Intimate Partner Violence: Not on file    No family history on file.  Past Surgical History:  Procedure Laterality Date  . NO PAST SURGERIES      ROS: Review of Systems Negative except as stated above  PHYSICAL EXAM: BP 111/75 (BP Location: Left Arm, Patient Position: Sitting, Cuff Size: Normal)   Pulse 67   Temp 98.1 F (36.7 C)   Resp 15   Ht 5' 2.01" (1.575 m)   Wt 181 lb (82.1 kg)   SpO2 100%   BMI 33.10 kg/m   Physical Exam HENT:     Head: Normocephalic and atraumatic.  Cardiovascular:     Rate and Rhythm: Normal rate and regular rhythm.     Heart sounds: Normal heart sounds.  Pulmonary:  Effort: Pulmonary effort is normal.     Breath sounds: Normal breath sounds.  Abdominal:     General: Bowel sounds are normal.     Palpations: Abdomen is soft.     Tenderness: There is abdominal tenderness in the epigastric area.  Musculoskeletal:     Cervical back: Normal range of motion and neck supple.     Lumbar back: Tenderness present.     Comments: Tenderness on palpation of right lower back.  Neurological:     General: No focal deficit present.     Mental Status: She is alert.  Psychiatric:        Mood and Affect: Mood normal.        Behavior: Behavior normal.    ASSESSMENT AND PLAN: 1. Abdominal pain,  unspecified abdominal location: - Urine negative for nitrites, does have small leukocytes and large blood. Sending urine culture for further evaluation.  - Cervicovaginal self-swab to screen for chlamydia, gonorrhea, trichomonas, bacterial vaginitis, and candida vaginitis. - Ultrasound abdomen for further evaluation.  - CBC to screen blood count. - BMP to evaluate kidney function and electrolyte balance. - Follow-up with primary provider in 4 weeks or sooner if needed.  - CBC - Basic Metabolic Panel  - Comprehensive metabolic panel - Urine Culture - POCT URINALYSIS DIP (CLINITEK) - Cervicovaginal ancillary only - US Abdomen Complete; Future  2. Chronic right-sided low back pain without sciatica: - Meloxicam as prescribed.  - BMP to evaluate kidney function and electrolyte balance. - Diagnostic x-ray of lumbar spine for further evaluation.  - Follow-up with primary provider in 4 weeks or sooner if needed.  - Basic Metabolic Panel  - meloxicam (MOBIC) 7.5 MG tablet; Take 1 tablet (7.5 mg total) by mouth daily.  Dispense: 60 tablet; Refill: 0 - DG Lumbar Spine Complete; Future  3. Blood in stool: - Patient concern for blood when wiping with bowel movements.  - Referral to Gastroenterology for further evaluation and management.  - Ambulatory referral to Gastroenterology  4. Language barrier: - [employer] participated during today's visit. Interpreter Name: Debarah Crape, ID#: [id number].   Patient was given the opportunity to ask questions.  Patient verbalized understanding of the plan and was able to repeat key elements of the plan. Patient was given clear instructions to go to Emergency Department or return to medical center if symptoms don't improve, worsen, or new problems develop.The patient verbalized understanding.   Orders Placed This Encounter  Procedures  . US Abdomen Complete  . DG Lumbar Spine Complete  . CBC  . Comprehensive metabolic panel  . Ambulatory referral to  Gastroenterology  . POCT URINALYSIS DIP (CLINITEK)     Requested Prescriptions   Signed Prescriptions Disp Refills  . meloxicam (MOBIC) 7.5 MG tablet 60 tablet 0    Sig: Take 1 tablet (7.5 mg total) by mouth daily.   Follow-up with primary provider in 4 weeks or sooner if needed.   Rema Fendt, NP

## 2020-12-27 ENCOUNTER — Encounter: Payer: Self-pay | Admitting: Family

## 2020-12-27 ENCOUNTER — Ambulatory Visit (INDEPENDENT_AMBULATORY_CARE_PROVIDER_SITE_OTHER): Payer: Self-pay | Admitting: Family

## 2020-12-27 ENCOUNTER — Ambulatory Visit (INDEPENDENT_AMBULATORY_CARE_PROVIDER_SITE_OTHER): Payer: Self-pay

## 2020-12-27 ENCOUNTER — Other Ambulatory Visit: Payer: Self-pay

## 2020-12-27 ENCOUNTER — Other Ambulatory Visit (HOSPITAL_COMMUNITY)
Admission: RE | Admit: 2020-12-27 | Discharge: 2020-12-27 | Disposition: A | Discharge status: Home / Self Care | Payer: Self-pay | Source: Ambulatory Visit | Attending: Family | Admitting: Family

## 2020-12-27 VITALS — BP 111/75 | HR 67 | Temp 98.1°F | Resp 15 | Ht 62.01 in | Wt 181.0 lb

## 2020-12-27 DIAGNOSIS — M545 Low back pain, unspecified: Secondary | ICD-10-CM

## 2020-12-27 DIAGNOSIS — K921 Melena: Secondary | ICD-10-CM

## 2020-12-27 DIAGNOSIS — G8929 Other chronic pain: Secondary | ICD-10-CM

## 2020-12-27 DIAGNOSIS — R109 Unspecified abdominal pain: Secondary | ICD-10-CM | POA: Insufficient documentation

## 2020-12-27 DIAGNOSIS — Z789 Other specified health status: Secondary | ICD-10-CM

## 2020-12-27 LAB — POCT URINALYSIS DIP (CLINITEK)
Bilirubin, UA: NEGATIVE
Glucose, UA: NEGATIVE mg/dL
Ketones, POC UA: NEGATIVE mg/dL
Nitrite, UA: NEGATIVE
POC PROTEIN,UA: NEGATIVE
Spec Grav, UA: >=1.03 — AB (ref 1.010–1.025)
Urobilinogen, UA: 0.2 E.U./dL
pH, UA: 6 (ref 5.0–8.0)

## 2020-12-27 MED ORDER — MELOXICAM 7.5 MG PO TABS: 7.5000 mg | ORAL_TABLET | Freq: Every day | ORAL | 0 refills | Status: AC

## 2020-12-27 NOTE — Progress Notes (Signed)
Lower back and abdominal pain for about 1 month pain level 10

## 2020-12-27 NOTE — Progress Notes (Signed)
Lumbar x-ray normal

## 2020-12-28 LAB — COMPREHENSIVE METABOLIC PANEL
ALT: 14 IU/L (ref 0–32)
AST: 13 IU/L (ref 0–40)
Albumin/Globulin Ratio: 1.5 (ref 1.2–2.2)
Albumin: 4.4 g/dL (ref 3.8–4.8)
Alkaline Phosphatase: 97 IU/L (ref 44–121)
BUN/Creatinine Ratio: 24 — ABNORMAL HIGH (ref 9–23)
BUN: 17 mg/dL (ref 6–20)
Bilirubin Total: <0.2 mg/dL (ref 0.0–1.2)
CO2: 25 mmol/L (ref 20–29)
Calcium: 9.3 mg/dL (ref 8.7–10.2)
Chloride: 102 mmol/L (ref 96–106)
Creatinine, Ser: 0.71 mg/dL (ref 0.57–1.00)
Globulin, Total: 2.9 g/dL (ref 1.5–4.5)
Glucose: 70 mg/dL (ref 65–99)
Potassium: 5.3 mmol/L — ABNORMAL HIGH (ref 3.5–5.2)
Sodium: 139 mmol/L (ref 134–144)
Total Protein: 7.3 g/dL (ref 6.0–8.5)
eGFR: 112 mL/min/{1.73_m2} (ref >59)

## 2020-12-28 LAB — CBC
Hematocrit: 40.7 % (ref 34.0–46.6)
Hemoglobin: 13.3 g/dL (ref 11.1–15.9)
MCH: 28.4 pg (ref 26.6–33.0)
MCHC: 32.7 g/dL (ref 31.5–35.7)
MCV: 87 fL (ref 79–97)
Platelets: 274 10*3/uL (ref 150–450)
RBC: 4.69 x10E6/uL (ref 3.77–5.28)
RDW: 12.3 % (ref 11.7–15.4)
WBC: 7.5 10*3/uL (ref 3.4–10.8)

## 2020-12-28 NOTE — Progress Notes (Signed)
Kidney function normal.   Liver function normal.   No anemia.   Potassium slightly elevated. Decrease potassium rich foods such as bananas and oranges.   Urine culture pending.

## 2020-12-29 LAB — URINE CULTURE

## 2020-12-30 LAB — MOLECULAR ANCILLARY ONLY
Bacterial Vaginitis (gardnerella): NEGATIVE
Candida Glabrata: NEGATIVE
Candida Vaginitis: NEGATIVE
Chlamydia: NEGATIVE
Comment: NEGATIVE
Comment: NEGATIVE
Comment: NEGATIVE
Comment: NEGATIVE
Comment: NEGATIVE
Comment: NORMAL
Neisseria Gonorrhea: NEGATIVE
Trichomonas: NEGATIVE

## 2020-12-30 NOTE — Progress Notes (Signed)
Urine culture normal.

## 2021-01-01 ENCOUNTER — Ambulatory Visit (HOSPITAL_COMMUNITY): Admission: RE | Admit: 2021-01-01 | Payer: Self-pay | Source: Ambulatory Visit

## 2021-01-03 NOTE — Progress Notes (Signed)
Gonorrhea, chlamydia, trichomonas, bacterial vaginitis, and yeast infection negative.

## 2021-01-10 ENCOUNTER — Ambulatory Visit (HOSPITAL_COMMUNITY): Admission: RE | Admit: 2021-01-10 | Payer: Self-pay | Source: Ambulatory Visit

## 2021-01-13 ENCOUNTER — Encounter: Payer: Self-pay | Admitting: Physician Assistant

## 2021-02-02 ENCOUNTER — Other Ambulatory Visit: Payer: Self-pay

## 2021-02-02 ENCOUNTER — Emergency Department (HOSPITAL_COMMUNITY)
Admission: EM | Admit: 2021-02-02 | Discharge: 2021-02-02 | Disposition: A | Discharge status: Home / Self Care | Payer: Self-pay | Attending: Emergency Medicine | Admitting: Emergency Medicine

## 2021-02-02 ENCOUNTER — Encounter (HOSPITAL_COMMUNITY): Payer: Self-pay | Admitting: Emergency Medicine

## 2021-02-02 ENCOUNTER — Emergency Department (HOSPITAL_COMMUNITY): Payer: Self-pay

## 2021-02-02 DIAGNOSIS — R0789 Other chest pain: Secondary | ICD-10-CM | POA: Insufficient documentation

## 2021-02-02 DIAGNOSIS — J45909 Unspecified asthma, uncomplicated: Secondary | ICD-10-CM | POA: Insufficient documentation

## 2021-02-02 DIAGNOSIS — Y92410 Unspecified street and highway as the place of occurrence of the external cause: Secondary | ICD-10-CM | POA: Insufficient documentation

## 2021-02-02 LAB — CBC
HCT: 39.4 % (ref 36.0–46.0)
Hemoglobin: 12.7 g/dL (ref 12.0–15.0)
MCH: 28.1 pg (ref 26.0–34.0)
MCHC: 32.2 g/dL (ref 30.0–36.0)
MCV: 87.2 fL (ref 80.0–100.0)
Platelets: 247 10*3/uL (ref 150–400)
RBC: 4.52 MIL/uL (ref 3.87–5.11)
RDW: 12.5 % (ref 11.5–15.5)
WBC: 6.9 10*3/uL (ref 4.0–10.5)
nRBC: 0 % (ref 0.0–0.2)

## 2021-02-02 LAB — BASIC METABOLIC PANEL
Anion gap: 10 (ref 5–15)
BUN: 11 mg/dL (ref 6–20)
CO2: 23 mmol/L (ref 22–32)
Calcium: 9.1 mg/dL (ref 8.9–10.3)
Chloride: 104 mmol/L (ref 98–111)
Creatinine, Ser: 0.59 mg/dL (ref 0.44–1.00)
GFR, Estimated: >60 mL/min (ref >60)
Glucose, Bld: 121 mg/dL — ABNORMAL HIGH (ref 70–99)
Potassium: 3.9 mmol/L (ref 3.5–5.1)
Sodium: 137 mmol/L (ref 135–145)

## 2021-02-02 LAB — TROPONIN I (HIGH SENSITIVITY): Troponin I (High Sensitivity): <2 ng/L (ref <18)
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
## 2021-02-02 NOTE — Discharge Instructions (Addendum)
Recommend 600 mg ibuprofen every 8 hours as needed for pain.  Recommend 1000 mg of Tylenol every 6 hours as needed for pain.
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
## 2021-02-14 ENCOUNTER — Ambulatory Visit: Payer: Self-pay | Admitting: Physician Assistant

## 2021-10-29 ENCOUNTER — Ambulatory Visit: Payer: Self-pay | Admitting: *Deleted

## 2021-10-29 NOTE — Telephone Encounter (Signed)
?  Chief Complaint: Runny Nose ?Symptoms: runny nose, itching throat, ears, eyes, eyes are watery. Mild SOB at night only ?Frequency: 3 weeks ago ?Pertinent Negatives: Patient denies Wheezing ?Disposition: [] ED /[] Urgent Care (no appt availability in office) / [] Appointment(In office/virtual)/ []   Virtual Care/ [] Home Care/ [] Refused Recommended Disposition /[]  Mobile Bus/ []  Follow-up with PCP ?Additional Notes: Pt has been taking multiple OTC meds, ineffective. Appt secured for tomorrow. CAre advise given, verbalizes understanding. Reason for Disposition ? [1] Taking antihistamines > 2 days AND [2] nasal allergy symptoms interfere with sleep, school, or work ? ?Answer Assessment - Initial Assessment Questions ?1. SYMPTOM: "What's the main symptom you're concerned about?" (e.g., runny nose, stuffiness, sneezing, itching) ?    Runny nose, itching throat, eyes, ears ?2. SEVERITY: "How bad is it?" "What does it keep you from doing?" (e.g., sleeping, working)  ?    Onset 3 weeks ago ?3. EYES: "Are the eyes also red, watery, and itchy?"  ?    yes ?4. TRIGGER: "What pollen or other allergic substance do you think is causing the symptoms?"  ?    Seasonal ?5. TREATMENT: "What medicine are you using?" "What medicine worked best in the past?" ?    OTC meds ?6. OTHER SYMPTOMS: "Do you have any other symptoms?" (e.g., coughing, difficulty breathing, wheezing) ?    Mild SOB at night ? ?Protocols used: Nasal Allergies (Hay Fever)-A-AH ? ?

## 2021-10-30 ENCOUNTER — Other Ambulatory Visit: Payer: Self-pay

## 2021-10-30 ENCOUNTER — Ambulatory Visit: Payer: Self-pay | Attending: Physician Assistant | Admitting: Physician Assistant

## 2021-10-30 ENCOUNTER — Encounter: Payer: Self-pay | Admitting: Physician Assistant

## 2021-10-30 VITALS — BP 108/74 | HR 73 | Wt 195.4 lb

## 2021-10-30 DIAGNOSIS — Z789 Other specified health status: Secondary | ICD-10-CM

## 2021-10-30 DIAGNOSIS — J302 Other seasonal allergic rhinitis: Secondary | ICD-10-CM

## 2021-10-30 DIAGNOSIS — T7840XA Allergy, unspecified, initial encounter: Secondary | ICD-10-CM

## 2021-10-30 MED ORDER — CETIRIZINE HCL 10 MG PO TABS
10.0000 mg | ORAL_TABLET | Freq: Every day | ORAL | 3 refills | Status: AC
  Filled 2021-10-30: qty 100, 100d supply, fill #0

## 2021-10-30 MED ORDER — CETIRIZINE HCL 10 MG PO TABS: 10.0000 mg | ORAL_TABLET | Freq: Every day | ORAL | 3 refills | Status: DC

## 2021-10-30 MED ORDER — OLOPATADINE HCL 0.1 % OP SOLN: 1.0000 [drp] | Freq: Two times a day (BID) | OPHTHALMIC | 12 refills | Status: DC

## 2021-10-30 MED ORDER — FLUTICASONE PROPIONATE 50 MCG/ACT NA SUSP
2.0000 | Freq: Every day | NASAL | 6 refills | Status: AC
  Filled 2021-10-30: qty 16, 25d supply, fill #0

## 2021-10-30 MED ORDER — OLOPATADINE HCL 0.1 % OP SOLN
1.0000 [drp] | Freq: Two times a day (BID) | OPHTHALMIC | 12 refills | Status: AC
  Filled 2021-10-30: qty 5, 25d supply, fill #0

## 2021-10-30 MED ORDER — FLUTICASONE PROPIONATE 50 MCG/ACT NA SUSP: 2.0000 | Freq: Every day | NASAL | 6 refills | Status: DC

## 2021-10-30 NOTE — Progress Notes (Signed)
? ?Karla Reynolds, is a 41 y.o. female ? ?ZWC:[id number] ? ?MPN:[id number] ? ?DOB - 10-Aug-1980 ? ?Chief Complaint  ?Patient presents with  ? Allergic Reaction  ?    ? ?Subjective:  ? ?Karla Reynolds is a 41 y.o. female here today for itchy eyes, runny nose, R ear pressure with sneezing a lot over the last 3 weeks.  Claritin helped initially but not now.  No fever.  Some cough at night.  No body aches/N/V ? ?No problems updated. ? ?ALLERGIES: ?Allergies  ?Allergen Reactions  ? Penicillins Other (See Comments)  ?  Body goes to sleep ?Has patient had a PCN reaction causing immediate rash, facial/tongue/throat swelling, SOB or lightheadedness with hypotension: Yes ?Has patient had a PCN reaction causing severe rash involving mucus membranes or skin necrosis: No ?Has patient had a PCN reaction that required hospitalization No ?Has patient had a PCN reaction occurring within the last 10 years: No ?If all of the above answers are "NO", then may proceed with Cephalosporin use.  ? ? ?PAST MEDICAL HISTORY: ?Past Medical History:  ?Diagnosis Date  ? Asthma   ? ? ?MEDICATIONS AT HOME: ?Prior to Admission medications   ?Medication Sig Start Date End Date Taking? Authorizing Provider  ?cetirizine (ZYRTEC) 10 MG tablet Take 1 tablet (10 mg total) by mouth daily. 10/30/21  Yes Anders Simmonds, PA-C  ?fluticasone (FLONASE) 50 MCG/ACT nasal spray Place 2 sprays into both nostrils daily. 10/30/21  Yes Anders Simmonds, PA-C  ?olopatadine (PATANOL) 0.1 % ophthalmic solution Place 1 drop into both eyes 2 (two) times daily. Prn allergies 10/30/21  Yes Corene Resnick, Marzella Schlein, PA-C  ?meloxicam (MOBIC) 7.5 MG tablet Take 1 tablet (7.5 mg total) by mouth daily. ?Patient not taking: Reported on 10/30/2021 12/27/20   Rema Fendt, NP  ?sertraline (ZOLOFT) 50 MG tablet Take 1 tablet (50 mg total) by mouth daily. ?Patient not taking: Reported on 09/21/2014 03/12/14 03/18/15  Ambrose Finland, NP  ? ? ?ROS: ?Neg HEENT ?Neg resp ?Neg cardiac ?Neg  GI ?Neg GU ?Neg MS ?Neg psych ?Neg neuro ? ?Objective:  ? ?Vitals:  ? 10/30/21 0933  ?BP: 108/74  ?Pulse: 73  ?SpO2: 99%  ?Weight: 195 lb 6.4 oz (88.6 kg)  ? ?Exam ?General appearance : Awake, alert, not in any distress. Speech Clear. Not toxic looking ?HEENT: Atraumatic and Normocephalic, eyes with pale erythema B and mild chemosis of outer canthus B.  B TM congested without erythema.  Throat with PND without trued redness and no exudate.   ?Neck: Supple, no JVD. No cervical lymphadenopathy.  ?Chest: Good air entry bilaterally, CTAB.  No rales/rhonchi/wheezing ?CVS: S1 S2 regular, no murmurs.  ?Extremities: B/L Lower Ext shows no edema, both legs are warm to touch ?Neurology: Awake alert, and oriented X 3, CN II-XII intact, Non focal ?Skin: No Rash ? ?Data Review ?Lab Results  ?Component Value Date  ? HGBA1C 5.5 08/25/2018  ? HGBA1C 4.9 02/26/2016  ? HGBA1C 5.5 02/23/2014  ? ? ?Assessment & Plan  ? ?1. Allergy, initial encounter ?- cetirizine (ZYRTEC) 10 MG tablet; Take 1 tablet (10 mg total) by mouth daily.  Dispense: 100 tablet; Refill: 3 ?- olopatadine (PATANOL) 0.1 % ophthalmic solution; Place 1 drop into both eyes 2 (two) times daily. Prn allergies  Dispense: 5 mL; Refill: 12 ?- fluticasone (FLONASE) 50 MCG/ACT nasal spray; Place 2 sprays into both nostrils daily.  Dispense: 16 g; Refill: 6 ? ?2. Language barrier ?[employer] interpreters "Sherlon Handing" used and additional time performing  visit was required. ? ? ? ? ?Patient have been counseled extensively about nutrition and exercise. Other issues discussed during this visit include: low cholesterol diet, weight control and daily exercise, foot care, annual eye examinations at Ophthalmology, importance of adherence with medications and regular follow-up. We also discussed long term complications of uncontrolled diabetes and hypertension.  ? ?Return if symptoms worsen or fail to improve. ? ?The patient was given clear instructions to go to ER or return to medical center  if symptoms don't improve, worsen or new problems develop. The patient verbalized understanding. The patient was told to call to get lab results if they haven't heard anything in the next week.  ? ? ? ? ?Georgian Co, PA-C ?[facility] ?[place], [place] ?[phone]   ?10/30/2021, 9:45 AM [name], female   DOB: 1981/04/02, 41 y.o.   MRN: [id number] ? ?

## 2021-12-26 ENCOUNTER — Other Ambulatory Visit: Payer: Self-pay | Admitting: Physician Assistant

## 2021-12-26 DIAGNOSIS — R1013 Epigastric pain: Secondary | ICD-10-CM

## 2022-01-07 ENCOUNTER — Other Ambulatory Visit: Payer: Self-pay

## 2022-01-16 ENCOUNTER — Other Ambulatory Visit: Payer: Self-pay

## 2022-10-09 ENCOUNTER — Other Ambulatory Visit: Payer: Self-pay
